# Patient Record
Sex: Female | Born: 1990
Health system: Southern US, Community
[De-identification: ages and names within clinical notes are randomized; demographics above are authoritative.]

## PROBLEM LIST (undated history)

## (undated) ENCOUNTER — Inpatient Hospital Stay (HOSPITAL_COMMUNITY): Payer: Self-pay

## (undated) DIAGNOSIS — F419 Anxiety disorder, unspecified: Secondary | ICD-10-CM

## (undated) DIAGNOSIS — R87629 Unspecified abnormal cytological findings in specimens from vagina: Secondary | ICD-10-CM

## (undated) DIAGNOSIS — R87619 Unspecified abnormal cytological findings in specimens from cervix uteri: Secondary | ICD-10-CM

## (undated) DIAGNOSIS — F32A Depression, unspecified: Secondary | ICD-10-CM

## (undated) DIAGNOSIS — F329 Major depressive disorder, single episode, unspecified: Secondary | ICD-10-CM

## (undated) DIAGNOSIS — IMO0002 Reserved for concepts with insufficient information to code with codable children: Secondary | ICD-10-CM

## (undated) DIAGNOSIS — Z8619 Personal history of other infectious and parasitic diseases: Secondary | ICD-10-CM

## (undated) DIAGNOSIS — J45909 Unspecified asthma, uncomplicated: Secondary | ICD-10-CM

## (undated) DIAGNOSIS — G971 Other reaction to spinal and lumbar puncture: Secondary | ICD-10-CM

## (undated) DIAGNOSIS — R51 Headache: Secondary | ICD-10-CM

## (undated) DIAGNOSIS — O24419 Gestational diabetes mellitus in pregnancy, unspecified control: Secondary | ICD-10-CM

## (undated) HISTORY — DX: Unspecified abnormal cytological findings in specimens from cervix uteri: R87.619

## (undated) HISTORY — PX: OTHER SURGICAL HISTORY: SHX169

## (undated) HISTORY — DX: Major depressive disorder, single episode, unspecified: F32.9

## (undated) HISTORY — DX: Anxiety disorder, unspecified: F41.9

## (undated) HISTORY — DX: Unspecified abnormal cytological findings in specimens from vagina: R87.629

## (undated) HISTORY — PX: COLPOSCOPY: SHX161

## (undated) HISTORY — DX: Personal history of other infectious and parasitic diseases: Z86.19

## (undated) HISTORY — DX: Reserved for concepts with insufficient information to code with codable children: IMO0002

## (undated) HISTORY — DX: Other reaction to spinal and lumbar puncture: G97.1

## (undated) HISTORY — DX: Gestational diabetes mellitus in pregnancy, unspecified control: O24.419

## (undated) HISTORY — PX: TYMPANOSTOMY TUBE PLACEMENT: SHX32

## (undated) HISTORY — DX: Headache: R51

## (undated) HISTORY — DX: Depression, unspecified: F32.A

---
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
## 2011-10-24 ENCOUNTER — Other Ambulatory Visit: Payer: Self-pay | Admitting: Gastroenterology

## 2011-10-24 DIAGNOSIS — R110 Nausea: Secondary | ICD-10-CM

## 2011-10-29 ENCOUNTER — Ambulatory Visit
Admission: RE | Admit: 2011-10-29 | Discharge: 2011-10-29 | Disposition: A | Discharge status: Home / Self Care | Payer: BC Managed Care – PPO | Source: Ambulatory Visit | Attending: Gastroenterology | Admitting: Gastroenterology

## 2011-10-29 DIAGNOSIS — R110 Nausea: Secondary | ICD-10-CM

## 2011-12-04 LAB — OB RESULTS CONSOLE GC/CHLAMYDIA
Chlamydia: NEGATIVE
Gonorrhea: NEGATIVE

## 2011-12-04 LAB — OB RESULTS CONSOLE RPR: RPR: NONREACTIVE

## 2011-12-04 LAB — OB RESULTS CONSOLE HEPATITIS B SURFACE ANTIGEN: Hepatitis B Surface Ag: NEGATIVE

## 2011-12-04 LAB — OB RESULTS CONSOLE RUBELLA ANTIBODY, IGM: Rubella: IMMUNE

## 2011-12-04 LAB — OB RESULTS CONSOLE ABO/RH: RH Type: POSITIVE

## 2011-12-04 LAB — OB RESULTS CONSOLE ANTIBODY SCREEN: Antibody Screen: NEGATIVE

## 2011-12-04 LAB — OB RESULTS CONSOLE HIV ANTIBODY (ROUTINE TESTING): HIV: NONREACTIVE

## 2012-07-07 ENCOUNTER — Inpatient Hospital Stay (HOSPITAL_COMMUNITY)
Admission: AD | Admit: 2012-07-07 | Discharge: 2012-07-07 | Disposition: A | Discharge status: Home / Self Care | Payer: BC Managed Care – PPO | Source: Ambulatory Visit | Attending: Obstetrics and Gynecology | Admitting: Obstetrics and Gynecology

## 2012-07-07 ENCOUNTER — Encounter (HOSPITAL_COMMUNITY): Payer: Self-pay

## 2012-07-07 DIAGNOSIS — O479 False labor, unspecified: Secondary | ICD-10-CM | POA: Insufficient documentation

## 2012-07-07 DIAGNOSIS — N949 Unspecified condition associated with female genital organs and menstrual cycle: Secondary | ICD-10-CM | POA: Insufficient documentation

## 2012-07-07 HISTORY — DX: Unspecified asthma, uncomplicated: J45.909

## 2012-07-07 NOTE — MAU Note (Signed)
Patient states she has some leaking of clear fluid at about 1530, not actively leaking at this time. Denies bleeding or contractions and reports good fetal movement.

## 2012-07-14 NOTE — L&D Delivery Note (Deleted)
Delivery Note At  a viable female was delivered via  NSVD (Presentation:oa ;  ).  APGAR:8/9 , ; weight . pending  Placenta status:intact , .3 vessel  Cord:  with the following complications: none.  Cord pH: na  Anesthesia: Epidural  Episiotomy: none Lacerations: second degree Suture Repair: 2.0 chromic Est. Blood Loss (mL): 250 cc  Mom to postpartum.  Baby to nursery-stable.  [name] S 07/26/2012, 12:09 PM

## 2012-07-16 ENCOUNTER — Encounter (HOSPITAL_COMMUNITY): Payer: Self-pay | Admitting: *Deleted

## 2012-07-16 ENCOUNTER — Telehealth (HOSPITAL_COMMUNITY): Payer: Self-pay | Admitting: *Deleted

## 2012-07-16 LAB — OB RESULTS CONSOLE GBS: GBS: NEGATIVE

## 2012-07-16 NOTE — Telephone Encounter (Signed)
Preadmission screen  

## 2012-07-20 ENCOUNTER — Inpatient Hospital Stay (HOSPITAL_COMMUNITY): Admission: RE | Admit: 2012-07-20 | Payer: BC Managed Care – PPO | Source: Ambulatory Visit

## 2012-07-25 ENCOUNTER — Inpatient Hospital Stay (HOSPITAL_COMMUNITY)
Admission: RE | Admit: 2012-07-25 | Discharge: 2012-07-29 | DRG: 371 | Disposition: A | Discharge status: Home / Self Care | Payer: BC Managed Care – PPO | Source: Ambulatory Visit | Attending: Obstetrics and Gynecology | Admitting: Obstetrics and Gynecology

## 2012-07-25 VITALS — BP 119/82 | HR 93 | Temp 97.8°F | Resp 18 | Ht 65.0 in | Wt 195.0 lb

## 2012-07-25 DIAGNOSIS — O480 Post-term pregnancy: Principal | ICD-10-CM | POA: Diagnosis present

## 2012-07-25 DIAGNOSIS — Z98891 History of uterine scar from previous surgery: Secondary | ICD-10-CM

## 2012-07-25 DIAGNOSIS — O324XX0 Maternal care for high head at term, not applicable or unspecified: Secondary | ICD-10-CM | POA: Diagnosis present

## 2012-07-25 LAB — CBC
HCT: 36 % (ref 36.0–46.0)
Hemoglobin: 12.4 g/dL (ref 12.0–15.0)
RBC: 4.08 MIL/uL (ref 3.87–5.11)
WBC: 11.6 10*3/uL — ABNORMAL HIGH (ref 4.0–10.5)

## 2012-07-25 MED ORDER — ONDANSETRON HCL 4 MG/2ML IJ SOLN: 4.0000 mg | Freq: Four times a day (QID) | INTRAMUSCULAR | Status: DC | PRN

## 2012-07-25 MED ORDER — OXYTOCIN 40 UNITS IN LACTATED RINGERS INFUSION - SIMPLE MED
62.5000 mL/h | INTRAVENOUS | Status: DC
  Filled 2012-07-25: qty 1000

## 2012-07-25 MED ORDER — FLEET ENEMA 7-19 GM/118ML RE ENEM: 1.0000 | ENEMA | RECTAL | Status: DC | PRN

## 2012-07-25 MED ORDER — ZOLPIDEM TARTRATE 5 MG PO TABS
5.0000 mg | ORAL_TABLET | Freq: Every evening | ORAL | Status: DC | PRN
  Administered 2012-07-25: 5 mg via ORAL
  Filled 2012-07-25: qty 1

## 2012-07-25 MED ORDER — OXYTOCIN BOLUS FROM INFUSION: 500.0000 mL | INTRAVENOUS | Status: DC

## 2012-07-25 MED ORDER — LACTATED RINGERS IV SOLN
INTRAVENOUS | Status: DC
  Administered 2012-07-25 – 2012-07-26 (×5): via INTRAVENOUS

## 2012-07-25 MED ORDER — CITRIC ACID-SODIUM CITRATE 334-500 MG/5ML PO SOLN
30.0000 mL | ORAL | Status: DC | PRN
  Administered 2012-07-26: 30 mL via ORAL

## 2012-07-25 MED ORDER — BUTORPHANOL TARTRATE 1 MG/ML IJ SOLN
1.0000 mg | INTRAMUSCULAR | Status: DC | PRN
  Administered 2012-07-26 (×2): 1 mg via INTRAVENOUS
  Filled 2012-07-25 (×2): qty 1

## 2012-07-25 MED ORDER — LIDOCAINE HCL (PF) 1 % IJ SOLN
30.0000 mL | INTRAMUSCULAR | Status: DC | PRN
  Filled 2012-07-25: qty 30

## 2012-07-25 MED ORDER — OXYTOCIN 40 UNITS IN LACTATED RINGERS INFUSION - SIMPLE MED: 1.0000 m[IU]/min | INTRAVENOUS | Status: DC

## 2012-07-25 MED ORDER — ACETAMINOPHEN 325 MG PO TABS: 650.0000 mg | ORAL_TABLET | ORAL | Status: DC | PRN

## 2012-07-25 MED ORDER — TERBUTALINE SULFATE 1 MG/ML IJ SOLN: 0.2500 mg | Freq: Once | INTRAMUSCULAR | Status: AC | PRN

## 2012-07-25 MED ORDER — OXYCODONE-ACETAMINOPHEN 5-325 MG PO TABS: 1.0000 | ORAL_TABLET | ORAL | Status: DC | PRN

## 2012-07-25 MED ORDER — LACTATED RINGERS IV SOLN: 500.0000 mL | INTRAVENOUS | Status: DC | PRN

## 2012-07-25 MED ORDER — IBUPROFEN 600 MG PO TABS: 600.0000 mg | ORAL_TABLET | Freq: Four times a day (QID) | ORAL | Status: DC | PRN

## 2012-07-26 ENCOUNTER — Encounter (HOSPITAL_COMMUNITY): Payer: Self-pay | Admitting: Anesthesiology

## 2012-07-26 ENCOUNTER — Encounter (HOSPITAL_COMMUNITY): Payer: Self-pay

## 2012-07-26 ENCOUNTER — Inpatient Hospital Stay (HOSPITAL_COMMUNITY): Payer: BC Managed Care – PPO | Admitting: Anesthesiology

## 2012-07-26 ENCOUNTER — Encounter (HOSPITAL_COMMUNITY)
Admission: RE | Disposition: A | Discharge status: Home / Self Care | Payer: Self-pay | Source: Ambulatory Visit | Attending: Obstetrics and Gynecology

## 2012-07-26 DIAGNOSIS — Z98891 History of uterine scar from previous surgery: Secondary | ICD-10-CM

## 2012-07-26 SURGERY — Surgical Case
Anesthesia: Epidural | Site: Abdomen | Wound class: Clean Contaminated

## 2012-07-26 MED ORDER — WITCH HAZEL-GLYCERIN EX PADS: 1.0000 "application " | MEDICATED_PAD | CUTANEOUS | Status: DC | PRN

## 2012-07-26 MED ORDER — DIBUCAINE 1 % RE OINT: 1.0000 "application " | TOPICAL_OINTMENT | RECTAL | Status: DC | PRN

## 2012-07-26 MED ORDER — ONDANSETRON HCL 4 MG PO TABS: 4.0000 mg | ORAL_TABLET | ORAL | Status: DC | PRN

## 2012-07-26 MED ORDER — OXYCODONE-ACETAMINOPHEN 5-325 MG PO TABS: 1.0000 | ORAL_TABLET | ORAL | Status: DC | PRN

## 2012-07-26 MED ORDER — SODIUM BICARBONATE 8.4 % IV SOLN
INTRAVENOUS | Status: AC
  Filled 2012-07-26: qty 50

## 2012-07-26 MED ORDER — LIDOCAINE-EPINEPHRINE (PF) 2 %-1:200000 IJ SOLN
INTRAMUSCULAR | Status: AC
  Filled 2012-07-26: qty 20

## 2012-07-26 MED ORDER — SIMETHICONE 80 MG PO CHEW
80.0000 mg | CHEWABLE_TABLET | Freq: Three times a day (TID) | ORAL | Status: DC
  Administered 2012-07-27 – 2012-07-29 (×6): 80 mg via ORAL

## 2012-07-26 MED ORDER — KETOROLAC TROMETHAMINE 30 MG/ML IJ SOLN
30.0000 mg | Freq: Four times a day (QID) | INTRAMUSCULAR | Status: AC | PRN
  Administered 2012-07-27: 30 mg via INTRAVENOUS
  Filled 2012-07-26: qty 1

## 2012-07-26 MED ORDER — ONDANSETRON HCL 4 MG/2ML IJ SOLN
INTRAMUSCULAR | Status: DC | PRN
  Administered 2012-07-26: 4 mg via INTRAVENOUS

## 2012-07-26 MED ORDER — ONDANSETRON HCL 4 MG/2ML IJ SOLN: 4.0000 mg | INTRAMUSCULAR | Status: DC | PRN

## 2012-07-26 MED ORDER — SENNOSIDES-DOCUSATE SODIUM 8.6-50 MG PO TABS
2.0000 | ORAL_TABLET | Freq: Every day | ORAL | Status: DC
  Administered 2012-07-27 – 2012-07-28 (×2): 2 via ORAL

## 2012-07-26 MED ORDER — SCOPOLAMINE 1 MG/3DAYS TD PT72
1.0000 | MEDICATED_PATCH | Freq: Once | TRANSDERMAL | Status: AC
  Administered 2012-07-26: 1.5 mg via TRANSDERMAL

## 2012-07-26 MED ORDER — OXYCODONE-ACETAMINOPHEN 5-325 MG PO TABS
1.0000 | ORAL_TABLET | ORAL | Status: DC | PRN
  Administered 2012-07-27 – 2012-07-29 (×2): 1 via ORAL
  Filled 2012-07-26 (×2): qty 1

## 2012-07-26 MED ORDER — TETANUS-DIPHTH-ACELL PERTUSSIS 5-2.5-18.5 LF-MCG/0.5 IM SUSP: 0.5000 mL | Freq: Once | INTRAMUSCULAR | Status: DC

## 2012-07-26 MED ORDER — SENNOSIDES-DOCUSATE SODIUM 8.6-50 MG PO TABS: 2.0000 | ORAL_TABLET | Freq: Every day | ORAL | Status: DC

## 2012-07-26 MED ORDER — BISACODYL 10 MG RE SUPP: 10.0000 mg | Freq: Every day | RECTAL | Status: DC | PRN

## 2012-07-26 MED ORDER — BUPIVACAINE HCL (PF) 0.25 % IJ SOLN
INTRAMUSCULAR | Status: DC | PRN
  Administered 2012-07-26: 10 mL via EPIDURAL

## 2012-07-26 MED ORDER — SODIUM CHLORIDE 0.9 % IJ SOLN: 3.0000 mL | Freq: Two times a day (BID) | INTRAMUSCULAR | Status: DC

## 2012-07-26 MED ORDER — SODIUM CHLORIDE 0.9 % IJ SOLN: 3.0000 mL | INTRAMUSCULAR | Status: DC | PRN

## 2012-07-26 MED ORDER — DIPHENHYDRAMINE HCL 25 MG PO CAPS: 25.0000 mg | ORAL_CAPSULE | Freq: Four times a day (QID) | ORAL | Status: DC | PRN

## 2012-07-26 MED ORDER — OXYTOCIN 40 UNITS IN LACTATED RINGERS INFUSION - SIMPLE MED: 62.5000 mL/h | INTRAVENOUS | Status: AC

## 2012-07-26 MED ORDER — LANOLIN HYDROUS EX OINT: TOPICAL_OINTMENT | CUTANEOUS | Status: DC | PRN

## 2012-07-26 MED ORDER — NALBUPHINE HCL 10 MG/ML IJ SOLN
5.0000 mg | INTRAMUSCULAR | Status: DC | PRN
Start: 2012-07-26 — End: 2012-07-29

## 2012-07-26 MED ORDER — MEPERIDINE HCL 25 MG/ML IJ SOLN
6.2500 mg | INTRAMUSCULAR | Status: AC | PRN
  Administered 2012-07-26 (×2): 6.25 mg via INTRAVENOUS

## 2012-07-26 MED ORDER — PHENYLEPHRINE 40 MCG/ML (10ML) SYRINGE FOR IV PUSH (FOR BLOOD PRESSURE SUPPORT)
80.0000 ug | PREFILLED_SYRINGE | INTRAVENOUS | Status: DC | PRN
  Filled 2012-07-26: qty 5

## 2012-07-26 MED ORDER — FENTANYL 2.5 MCG/ML BUPIVACAINE 1/10 % EPIDURAL INFUSION (WH - ANES)
14.0000 mL/h | INTRAMUSCULAR | Status: DC
  Administered 2012-07-26 (×2): 14 mL/h via EPIDURAL
  Filled 2012-07-26 (×2): qty 125

## 2012-07-26 MED ORDER — KETOROLAC TROMETHAMINE 60 MG/2ML IM SOLN
INTRAMUSCULAR | Status: AC
  Administered 2012-07-26: 60 mg via INTRAMUSCULAR
  Filled 2012-07-26: qty 2

## 2012-07-26 MED ORDER — MORPHINE SULFATE (PF) 0.5 MG/ML IJ SOLN
INTRAMUSCULAR | Status: DC | PRN
  Administered 2012-07-26: 1 mg via INTRAVENOUS
  Administered 2012-07-26: 4 mg via EPIDURAL

## 2012-07-26 MED ORDER — KETOROLAC TROMETHAMINE 30 MG/ML IJ SOLN: 30.0000 mg | Freq: Four times a day (QID) | INTRAMUSCULAR | Status: AC | PRN

## 2012-07-26 MED ORDER — MEPERIDINE HCL 25 MG/ML IJ SOLN
INTRAMUSCULAR | Status: DC | PRN
  Administered 2012-07-26: 25 mg via INTRAVENOUS

## 2012-07-26 MED ORDER — LACTATED RINGERS IV SOLN
INTRAVENOUS | Status: DC
  Administered 2012-07-27 (×2): via INTRAVENOUS

## 2012-07-26 MED ORDER — LACTATED RINGERS IV SOLN: 500.0000 mL | Freq: Once | INTRAVENOUS | Status: DC

## 2012-07-26 MED ORDER — DIPHENHYDRAMINE HCL 50 MG/ML IJ SOLN: 25.0000 mg | INTRAMUSCULAR | Status: DC | PRN

## 2012-07-26 MED ORDER — ONDANSETRON HCL 4 MG/2ML IJ SOLN
INTRAMUSCULAR | Status: AC
  Filled 2012-07-26: qty 2

## 2012-07-26 MED ORDER — MEPERIDINE HCL 25 MG/ML IJ SOLN
INTRAMUSCULAR | Status: AC
  Filled 2012-07-26: qty 1

## 2012-07-26 MED ORDER — LIDOCAINE HCL (PF) 1 % IJ SOLN
INTRAMUSCULAR | Status: DC | PRN
  Administered 2012-07-26 (×4): 4 mL

## 2012-07-26 MED ORDER — NALOXONE HCL 0.4 MG/ML IJ SOLN: 0.4000 mg | INTRAMUSCULAR | Status: DC | PRN

## 2012-07-26 MED ORDER — NALOXONE HCL 1 MG/ML IJ SOLN: 1.0000 ug/kg/h | INTRAVENOUS | Status: DC | PRN

## 2012-07-26 MED ORDER — IBUPROFEN 600 MG PO TABS: 600.0000 mg | ORAL_TABLET | Freq: Four times a day (QID) | ORAL | Status: DC

## 2012-07-26 MED ORDER — FENTANYL CITRATE 0.05 MG/ML IJ SOLN: 25.0000 ug | INTRAMUSCULAR | Status: DC | PRN

## 2012-07-26 MED ORDER — OXYTOCIN 10 UNIT/ML IJ SOLN
INTRAMUSCULAR | Status: AC
  Filled 2012-07-26: qty 1

## 2012-07-26 MED ORDER — LACTATED RINGERS IV SOLN
INTRAVENOUS | Status: DC | PRN
  Administered 2012-07-26: 19:00:00 via INTRAVENOUS

## 2012-07-26 MED ORDER — SCOPOLAMINE 1 MG/3DAYS TD PT72
MEDICATED_PATCH | TRANSDERMAL | Status: AC
  Filled 2012-07-26: qty 1

## 2012-07-26 MED ORDER — PRENATAL MULTIVITAMIN CH
1.0000 | ORAL_TABLET | Freq: Every day | ORAL | Status: DC
  Administered 2012-07-27 – 2012-07-29 (×3): 1 via ORAL
  Filled 2012-07-26 (×3): qty 1

## 2012-07-26 MED ORDER — PRENATAL MULTIVITAMIN CH: 1.0000 | ORAL_TABLET | Freq: Every day | ORAL | Status: DC

## 2012-07-26 MED ORDER — MEPERIDINE HCL 25 MG/ML IJ SOLN
INTRAMUSCULAR | Status: AC
  Administered 2012-07-26: 6.25 mg via INTRAVENOUS
  Filled 2012-07-26: qty 1

## 2012-07-26 MED ORDER — EPHEDRINE 5 MG/ML INJ
10.0000 mg | INTRAVENOUS | Status: DC | PRN
  Filled 2012-07-26: qty 4

## 2012-07-26 MED ORDER — SODIUM CHLORIDE 0.9 % IV SOLN: 250.0000 mL | INTRAVENOUS | Status: DC

## 2012-07-26 MED ORDER — LANOLIN HYDROUS EX OINT: 1.0000 "application " | TOPICAL_OINTMENT | CUTANEOUS | Status: DC | PRN

## 2012-07-26 MED ORDER — MORPHINE SULFATE 0.5 MG/ML IJ SOLN
INTRAMUSCULAR | Status: AC
  Filled 2012-07-26: qty 10

## 2012-07-26 MED ORDER — DIPHENHYDRAMINE HCL 25 MG PO CAPS
25.0000 mg | ORAL_CAPSULE | ORAL | Status: DC | PRN
  Filled 2012-07-26: qty 1

## 2012-07-26 MED ORDER — BENZOCAINE-MENTHOL 20-0.5 % EX AERO: 1.0000 "application " | INHALATION_SPRAY | CUTANEOUS | Status: DC | PRN

## 2012-07-26 MED ORDER — EPHEDRINE 5 MG/ML INJ: 10.0000 mg | INTRAVENOUS | Status: DC | PRN

## 2012-07-26 MED ORDER — BUPIVACAINE HCL (PF) 0.25 % IJ SOLN
INTRAMUSCULAR | Status: AC
  Filled 2012-07-26: qty 30

## 2012-07-26 MED ORDER — SIMETHICONE 80 MG PO CHEW: 80.0000 mg | CHEWABLE_TABLET | ORAL | Status: DC | PRN

## 2012-07-26 MED ORDER — PHENYLEPHRINE 40 MCG/ML (10ML) SYRINGE FOR IV PUSH (FOR BLOOD PRESSURE SUPPORT): 80.0000 ug | PREFILLED_SYRINGE | INTRAVENOUS | Status: DC | PRN

## 2012-07-26 MED ORDER — KETOROLAC TROMETHAMINE 60 MG/2ML IM SOLN
60.0000 mg | Freq: Once | INTRAMUSCULAR | Status: AC | PRN
  Administered 2012-07-26: 60 mg via INTRAMUSCULAR

## 2012-07-26 MED ORDER — DIPHENHYDRAMINE HCL 50 MG/ML IJ SOLN: 12.5000 mg | INTRAMUSCULAR | Status: DC | PRN

## 2012-07-26 MED ORDER — SODIUM BICARBONATE 8.4 % IV SOLN
INTRAVENOUS | Status: DC | PRN
  Administered 2012-07-26: 5 mL via EPIDURAL

## 2012-07-26 MED ORDER — MENTHOL 3 MG MT LOZG: 1.0000 | LOZENGE | OROMUCOSAL | Status: DC | PRN

## 2012-07-26 MED ORDER — ZOLPIDEM TARTRATE 5 MG PO TABS: 5.0000 mg | ORAL_TABLET | Freq: Every evening | ORAL | Status: DC | PRN

## 2012-07-26 MED ORDER — FLEET ENEMA 7-19 GM/118ML RE ENEM: 1.0000 | ENEMA | Freq: Every day | RECTAL | Status: DC | PRN

## 2012-07-26 MED ORDER — ONDANSETRON HCL 4 MG/2ML IJ SOLN: 4.0000 mg | Freq: Three times a day (TID) | INTRAMUSCULAR | Status: DC | PRN

## 2012-07-26 MED ORDER — CEFAZOLIN SODIUM-DEXTROSE 2-3 GM-% IV SOLR
INTRAVENOUS | Status: DC | PRN
  Administered 2012-07-26: 2 g via INTRAVENOUS

## 2012-07-26 MED ORDER — OXYTOCIN 10 UNIT/ML IJ SOLN
40.0000 [IU] | INTRAVENOUS | Status: DC | PRN
  Administered 2012-07-26: 40 [IU] via INTRAVENOUS

## 2012-07-26 MED ORDER — IBUPROFEN 600 MG PO TABS: 600.0000 mg | ORAL_TABLET | Freq: Four times a day (QID) | ORAL | Status: DC | PRN

## 2012-07-26 MED ORDER — METOCLOPRAMIDE HCL 5 MG/ML IJ SOLN: 10.0000 mg | Freq: Three times a day (TID) | INTRAMUSCULAR | Status: DC | PRN

## 2012-07-26 MED ORDER — IBUPROFEN 600 MG PO TABS
600.0000 mg | ORAL_TABLET | Freq: Four times a day (QID) | ORAL | Status: DC
  Administered 2012-07-27 – 2012-07-29 (×8): 600 mg via ORAL
  Filled 2012-07-26 (×8): qty 1

## 2012-07-26 SURGICAL SUPPLY — 36 items
ADH SKN CLS APL DERMABOND .7 (GAUZE/BANDAGES/DRESSINGS) ×1
CLOTH BEACON ORANGE TIMEOUT ST (SAFETY) ×2 IMPLANT
DERMABOND ADVANCED (GAUZE/BANDAGES/DRESSINGS) ×1
DERMABOND ADVANCED .7 DNX12 (GAUZE/BANDAGES/DRESSINGS) IMPLANT
DRAPE LG THREE QUARTER DISP (DRAPES) ×2 IMPLANT
DRESSING TELFA 8X3 (GAUZE/BANDAGES/DRESSINGS) IMPLANT
DRSG OPSITE POSTOP 4X10 (GAUZE/BANDAGES/DRESSINGS) ×2 IMPLANT
DURAPREP 26ML APPLICATOR (WOUND CARE) ×2 IMPLANT
ELECT REM PT RETURN 9FT ADLT (ELECTROSURGICAL) ×2
ELECTRODE REM PT RTRN 9FT ADLT (ELECTROSURGICAL) ×1 IMPLANT
EXTRACTOR VACUUM M CUP 4 TUBE (SUCTIONS) IMPLANT
GAUZE SPONGE 4X4 12PLY STRL LF (GAUZE/BANDAGES/DRESSINGS) ×2 IMPLANT
GLOVE BIO SURGEON STRL SZ7 (GLOVE) ×4 IMPLANT
GOWN STRL REIN XL XLG (GOWN DISPOSABLE) ×4 IMPLANT
KIT ABG SYR 3ML LUER SLIP (SYRINGE) ×2 IMPLANT
NDL HYPO 25X5/8 SAFETYGLIDE (NEEDLE) ×1 IMPLANT
NEEDLE HYPO 25X5/8 SAFETYGLIDE (NEEDLE) ×2 IMPLANT
NS IRRIG 1000ML POUR BTL (IV SOLUTION) ×2 IMPLANT
PACK C SECTION WH (CUSTOM PROCEDURE TRAY) ×2 IMPLANT
PAD ABD 7.5X8 STRL (GAUZE/BANDAGES/DRESSINGS) IMPLANT
PAD OB MATERNITY 4.3X12.25 (PERSONAL CARE ITEMS) ×2 IMPLANT
SLEEVE SCD COMPRESS KNEE MED (MISCELLANEOUS) IMPLANT
STAPLER VISISTAT 35W (STAPLE) IMPLANT
STRIP CLOSURE SKIN 1/4X4 (GAUZE/BANDAGES/DRESSINGS) ×2 IMPLANT
SUT CHROMIC 0 CT 1 (SUTURE) IMPLANT
SUT CHROMIC 0 CTX 36 (SUTURE) ×4 IMPLANT
SUT CHROMIC 2 0 SH (SUTURE) IMPLANT
SUT MON AB 4-0 PS1 27 (SUTURE) ×1 IMPLANT
SUT PDS AB 0 CT 36 (SUTURE) ×2 IMPLANT
SUT PLAIN 0 NONE (SUTURE) IMPLANT
SUT PLAIN 2 0 XLH (SUTURE) IMPLANT
SUT VIC AB 3-0 CT1 27 (SUTURE) ×2
SUT VIC AB 3-0 CT1 TAPERPNT 27 (SUTURE) ×1 IMPLANT
TOWEL OR 17X24 6PK STRL BLUE (TOWEL DISPOSABLE) ×6 IMPLANT
TRAY FOLEY CATH 14FR (SET/KITS/TRAYS/PACK) ×2 IMPLANT
WATER STERILE IRR 1000ML POUR (IV SOLUTION) ×1 IMPLANT

## 2012-07-26 NOTE — Brief Op Note (Signed)
07/25/2012 - 07/26/2012  7:49 PM  PATIENT:  [name]  22 y.o. female  PRE-OPERATIVE DIAGNOSIS:  Arrest of Descent  POST-OPERATIVE DIAGNOSIS:  Arrest of Descent  PROCEDURE:  Procedure(s) (LRB) with comments: CESAREAN SECTION (N/A)  SURGEON:  Surgeon(s) and Role:    * Juluis Mire, MD - Primary  PHYSICIAN ASSISTANT:   ASSISTANTS: none   ANESTHESIA:   epidural  EBL:  Total I/O In: -  Out: 700 [Urine:200; Blood:500]  BLOOD ADMINISTERED:none  DRAINS: Urinary Catheter (Foley)   LOCAL MEDICATIONS USED:  NONE  SPECIMEN:  No Specimen  DISPOSITION OF SPECIMEN:  N/A  COUNTS:  YES  TOURNIQUET:  * No tourniquets in log *  DICTATION: .Other Dictation: Dictation Number [id number]  PLAN OF CARE: Admit to inpatient   PATIENT DISPOSITION:  PACU - hemodynamically stable.   Delay start of Pharmacological VTE agent (>24hrs) due to surgical blood loss or risk of bleeding: no

## 2012-07-26 NOTE — H&P (Signed)
[name] is a 22 y.o. female presenting for induction for postdates. Is 23.1. Uncomplicated PNC> Maternal Medical History:  Fetal activity: Perceived fetal activity is normal.    Prenatal complications: no prenatal complications Prenatal Complications - Diabetes: none.    OB History    Grav Para Term Preterm Abortions TAB SAB Ect Mult Living   1              Past Medical History  Diagnosis Date  . H/O varicella   . Abnormal Pap smear     had colpo with abnormal paps afterward  . Asthma     childhood  . Headache     miagraine  . Anxiety   . Depression    Past Surgical History  Procedure Date  . Tympanostomy tube placement   . Wisdom teeth   . Colposcopy    Family History: family history includes Alcohol abuse in her maternal grandfather and paternal grandfather; Arthritis in her mother; Asthma in her mother; Cancer in her maternal aunt, maternal grandfather, and paternal uncle; Diabetes in her paternal aunt and paternal grandmother; Hyperlipidemia in her mother; Hypertension in her mother and paternal grandmother; Kidney Stones in her father; Stroke in her father; and Thyroid disease in her maternal grandmother. Social History:  reports that she has never smoked. She has never used smokeless tobacco. She reports that she does not drink alcohol or use illicit drugs.   Prenatal Transfer Tool  Maternal Diabetes: No Genetic Screening: Normal Maternal Ultrasounds/Referrals: Normal Fetal Ultrasounds or other Referrals:  None Maternal Substance Abuse:  No Significant Maternal Medications:  None Significant Maternal Lab Results:  None Other Comments:  None  ROS  Dilation: 2 Effacement (%): 90 Station: -2 Exam by:: Dr.Camillo Quadros Blood pressure 106/50, pulse 78, temperature 97.5 F (36.4 C), temperature source Oral, resp. rate 20, height 5\' 5"  (1.651 m), weight 88.451 kg (195 lb). Maternal Exam:  Uterine Assessment: Contraction strength is mild.  Contraction frequency  is regular.   Abdomen: Patient reports no abdominal tenderness. Fundal height is 39 cm.   Estimated fetal weight is 7.   Fetal presentation: vertex  Introitus: Amniotic fluid character: clear.  Pelvis: adequate for delivery.   Cervix: Cervix evaluated by digital exam.   Cervix 2+ and 90% vtx at -1  Physical Exam  Prenatal labs: ABO, Rh: O/Positive/-- (05/23 0000) Antibody: Negative (05/23 0000) Rubella: Immune (05/23 0000) RPR: NON REACTIVE (01/12 2015)  HBsAg: Negative (05/23 0000)  HIV: Non-reactive (05/23 0000)  GBS: Negative (01/03 0000)   Assessment/Plan: intrauterine pregnancy at 41.1 for induction Risk of pitocin discussed   [name] S 07/26/2012, 7:51 AM

## 2012-07-26 NOTE — Progress Notes (Signed)
Dr. Arelia Sneddon on phone notified of pt not feeling pressure in perinium or rectal area, pt feeling UC, SVE 10/100%/0,+1, pt to stop pushing and labor down.

## 2012-07-26 NOTE — Anesthesia Preprocedure Evaluation (Addendum)
Anesthesia Evaluation  Patient identified by MRN, date of birth, ID band Patient awake    Reviewed: Allergy & Precautions, H&P , NPO status , Patient's Chart, lab work & pertinent test results, reviewed documented beta blocker date and time   History of Anesthesia Complications Negative for: history of anesthetic complications  Airway Mallampati: I TM Distance: >3 FB Neck ROM: full    Dental  (+) Teeth Intact   Pulmonary  breath sounds clear to auscultation        Cardiovascular negative cardio ROS  Rhythm:regular Rate:Normal     Neuro/Psych  Headaches (migraines - last several months ago), PSYCHIATRIC DISORDERS (anxiety/depression - no meds)    GI/Hepatic negative GI ROS, Neg liver ROS,   Endo/Other  negative endocrine ROS  Renal/GU negative Renal ROS  negative genitourinary   Musculoskeletal   Abdominal   Peds  Hematology negative hematology ROS (+)   Anesthesia Other Findings   Reproductive/Obstetrics (+) Pregnancy (failure to descend)                          Anesthesia Physical Anesthesia Plan  ASA: II and emergent  Anesthesia Plan: Epidural   Post-op Pain Management:    Induction:   Airway Management Planned:   Additional Equipment:   Intra-op Plan:   Post-operative Plan:   Informed Consent: I have reviewed the patients History and Physical, chart, labs and discussed the procedure including the risks, benefits and alternatives for the proposed anesthesia with the patient or authorized representative who has indicated his/her understanding and acceptance.     Plan Discussed with: Surgeon and CRNA  Anesthesia Plan Comments:        Anesthesia Quick Evaluation

## 2012-07-26 NOTE — Anesthesia Procedure Notes (Signed)
Epidural Patient location during procedure: OB Start time: 07/26/2012 9:03 AM  Staffing Performed by: anesthesiologist   Preanesthetic Checklist Completed: patient identified, site marked, surgical consent, pre-op evaluation, timeout performed, IV checked, risks and benefits discussed and monitors and equipment checked  Epidural Patient position: sitting Prep: site prepped and draped and DuraPrep Patient monitoring: continuous pulse ox and blood pressure Approach: midline Injection technique: LOR air  Needle:  Needle type: Tuohy  Needle gauge: 17 G Needle length: 9 cm and 9 Needle insertion depth: 6 cm Catheter type: closed end flexible Catheter size: 19 Gauge Catheter at skin depth: 11 cm Test dose: negative  Assessment Events: blood not aspirated, injection not painful, no injection resistance, negative IV test and no paresthesia  Additional Notes Discussed risk of headache, infection, bleeding, nerve injury and failed or incomplete block.  Patient voices understanding and wishes to proceed.   Epidural placed easily on first attempt.  Patient tolerated procedure well with no apparent complications.  Jasmine December, MDReason for block:procedure for pain

## 2012-07-26 NOTE — Transfer of Care (Signed)
Immediate Anesthesia Transfer of Care Note  Patient: [name]  Procedure(s) Performed: Procedure(s) (LRB) with comments: CESAREAN SECTION (N/A)  Patient Location: PACU  Anesthesia Type:Epidural  Level of Consciousness: awake, alert , oriented and patient cooperative  Airway & Oxygen Therapy: Patient Spontanous Breathing  Post-op Assessment: Report given to PACU RN and Post -op Vital signs reviewed and stable  Post vital signs: Reviewed and stable  Complications: No apparent anesthesia complications

## 2012-07-26 NOTE — Anesthesia Postprocedure Evaluation (Signed)
  Anesthesia Post-op Note  Patient: [name]  Procedure(s) Performed: Procedure(s) (LRB) with comments: CESAREAN SECTION (N/A)  Patient Location: PACU  Anesthesia Type:Epidural  Level of Consciousness: awake, alert  and oriented  Airway and Oxygen Therapy: Patient Spontanous Breathing  Post-op Pain: none  Post-op Assessment: Post-op Vital signs reviewed, Patient's Cardiovascular Status Stable, Respiratory Function Stable, Patent Airway, No signs of Nausea or vomiting, Pain level controlled, No headache and No backache  Post-op Vital Signs: Reviewed and stable  Complications: No apparent anesthesia complications

## 2012-07-26 NOTE — Progress Notes (Signed)
Patient ID: [name], female   DOB: 25-Jan-1991, 22 y.o.   MRN: [id number] Pushing for > 2 hour no descent Decline further attempt at pushing Possible large efw  vtx with increased caput Precede with ceasarean section Risk of cesarean section discussed.  These include:  Risk of infection;  Risk of hemorrhage that could require transfusions with the associated risk of aids or hepatitis;  Excessive bleeding could require hysterectomy;  Risk of injury to adjacent organs including bladder, bowel or ureters;  Risk of DVT's and possible pulmonary embolus.  Patient expresses a understanding of indications and risks.;

## 2012-07-26 NOTE — Op Note (Signed)
Patient name  [name], [name] DICTATION#  [id number] CSN# [id number]  Juluis Mire, MD 07/26/2012 7:50 PM

## 2012-07-27 ENCOUNTER — Encounter (HOSPITAL_COMMUNITY): Payer: Self-pay

## 2012-07-27 LAB — CBC
MCHC: 33.9 g/dL (ref 30.0–36.0)
MCV: 89.1 fL (ref 78.0–100.0)
Platelets: 120 10*3/uL — ABNORMAL LOW (ref 150–400)
RDW: 13.7 % (ref 11.5–15.5)
WBC: 18.5 10*3/uL — ABNORMAL HIGH (ref 4.0–10.5)

## 2012-07-27 NOTE — Progress Notes (Signed)
Subjective: Postpartum Day 1: Cesarean Delivery Patient reports tolerating PO.    Objective: Vital signs in last 24 hours: Temp:  [97.6 F (36.4 C)-99.8 F (37.7 C)] 98.8 F (37.1 C) (01/14 0630) Pulse Rate:  [69-134] 108  (01/14 0630) Resp:  [16-24] 16  (01/14 0630) BP: (92-136)/(50-97) 92/52 mmHg (01/14 0630) SpO2:  [94 %-100 %] 96 % (01/14 0630)  Physical Exam:  General: alert and cooperative Lochia: appropriate Uterine Fundus: firm Incision: healing well DVT Evaluation: No evidence of DVT seen on physical exam. No significant calf/ankle edema. Foley continues, urine concentrated   [facility] 07/27/12 0540 07/25/12 2015  HGB 10.4* 12.4  HCT 30.1* 36.0    Assessment/Plan: Status post Cesarean section. Doing well postoperatively.  Encouraged oral fluids Continue current care.  CURTIS,CAROL G 07/27/2012, 7:44 AM

## 2012-07-27 NOTE — Anesthesia Postprocedure Evaluation (Signed)
  Anesthesia Post-op Note  Patient: [name]  Procedure(s) Performed: Procedure(s) (LRB) with comments: CESAREAN SECTION (N/A)  Patient Location: Mother/Baby  Anesthesia Type:Epidural  Level of Consciousness: awake, alert  and oriented  Airway and Oxygen Therapy: Patient Spontanous Breathing  Post-op Pain: mild  Post-op Assessment: Patient's Cardiovascular Status Stable, Respiratory Function Stable, No headache, No backache, No residual numbness and No residual motor weakness  Post-op Vital Signs: stable  Complications: No apparent anesthesia complications

## 2012-07-27 NOTE — Addendum Note (Signed)
Addendum  created 07/27/12 0839 by Earmon Phoenix, CRNA   Modules edited:Notes Section

## 2012-07-27 NOTE — Progress Notes (Signed)
Patient was referred for history of depression/anxiety.  * Referral screened out by Clinical Social Worker because none of the following criteria appear to apply:  ~ History of anxiety/depression during this pregnancy, or of post-partum depression.  ~ Diagnosis of anxiety and/or depression within last 5 years, as per pt.  ~ History of depression due to pregnancy loss/loss of child  OR  * Patient's symptoms currently being treated with medication and/or therapy.  Please contact the Clinical Social Worker if needs arise, or by the patient's request.   

## 2012-07-27 NOTE — Addendum Note (Signed)
Addendum  created 07/27/12 4782 by Earmon Phoenix, CRNA   Modules edited:Charges VN, Notes Section

## 2012-07-27 NOTE — Anesthesia Postprocedure Evaluation (Signed)
  Anesthesia Post-op Note  Patient: [name]  Procedure(s) Performed: Procedure(s) (LRB) with comments: CESAREAN SECTION (N/A)  Patient Location: Mother/Baby  Anesthesia Type:Epidural  Level of Consciousness: awake, alert  and oriented  Airway and Oxygen Therapy: Patient Spontanous Breathing  Post-op Pain: mild  Post-op Assessment: Patient's Cardiovascular Status Stable, Respiratory Function Stable, No headache, No backache, No residual numbness and No residual motor weakness  Post-op Vital Signs: stable  Complications: No apparent anesthesia complications 

## 2012-07-27 NOTE — Op Note (Signed)
NAMESURIE, [name]              ACCOUNT NO.:  [id number]  MEDICAL RECORD NO.:  [id number]  LOCATION:  9105                          FACILITY:  [facility]  PHYSICIAN:  Juluis Mire, M.D.   DATE OF BIRTH:  06/01/1991  DATE OF PROCEDURE:  07/26/2012 DATE OF DISCHARGE:                              OPERATIVE REPORT   PREOPERATIVE DIAGNOSIS:  Intrauterine pregnancy at 41 plus weeks with failure to descend.  POSTOPERATIVE DIAGNOSIS:  Intrauterine pregnancy at 41 plus weeks with failure to descend.  OPERATIVE PROCEDURE:  Low transverse cesarean section.  SURGEON:  Juluis Mire, MD  ANESTHESIA:  Epidural.  BLOOD LOSS:  4-500 mL.  PACKS:  None.  DRAINS:  Included urethral Foley.  INTRAOPERATIVE BLOOD PLACED:  None.  COMPLICATIONS:  None.  INDICATIONS:  A 22 year old female, presented at 41+ weeks for induction of labor.  She progressed to complete dilatation after 2 hours of pushing the vertex, remained arrested at approximately a +1 station. She had increasing caput.  We offered her to continue pushing.  She declined at the present time.  She states she is ready to proceed with cesarean section.  I think the head was too high for an assisted vaginal delivery.  Also, there was evidently a large estimated fetal weight. The risk of cesarean section were explained including the risk of infection.  Risk of hemorrhage that could require transfusion with the risk of AIDS or hepatitis.  Risk of injury to adjacent organs including bladder, bowel or ureters that could require a further exploratory surgery.  Risk of deep venous thrombosis and pulmonary embolus.  One other note, excessive bleeding could require a hysterectomy.  The patient expressed understanding of the indications and risks.  PROCEDURE:  The patient was taken to the OR and placed in supine position with left lateral tilt.  After satisfactory level of epidural anesthesia was obtained, the abdomen was prepped out with  Betadine and draped with sterile field.  A low transverse skin incision was made with a knife and carried through subcutaneous tissue.  Fascia was entered sharply.  Incision of the fascia was extended laterally.  Fascia was taken off the muscle superiorly inferiorly.  Rectus muscles were separated in the midline.  Peritoneum was entered sharply.  Incision of peritoneum was extended both superiorly and inferiorly.  A low- transverse bladder flap was developed.  A low transverse uterine incision begun with knife and extended laterally using manual traction. The infant presented in the vertex presentation and was delivered with elevation of head and fundal pressure.  The infant was a viable female, Apgars were 8 and 9.  Umbilical artery pH was 7.15.  Weight was pending. Placenta was delivered manually.  Uterus was exteriorized for closure. Uterus was closed with a running locking suture of 0 chromic using a two- layer closure technique.  We had good hemostasis.  Urine output was initially blood-tinged on arrival to the OR.  It was clearing at this point in time.  Tubes and ovaries were unremarkable.  Uterus was returned to the abdominal cavity.  We irrigated the pelvis and had no active bleeding.  Muscles and peritoneum were closed with running suture of 3-0 Vicryl.  Fascia was closed with running suture of 0 PDS.  Skin was closed with running subcuticular of 3-0 Monocryl and Dermabond. Sponge, instrument, and needle count was correct by circulating nurse x2.  Foley catheter was clear at the time of closure.  The patient tolerated the procedure well and was returned to recovery in good condition dictation.     Juluis Mire, M.D.     JSM/MEDQ  D:  07/26/2012  T:  07/27/2012  Job:  [id number]

## 2012-07-28 NOTE — Progress Notes (Signed)
Subjective: Postpartum Day 2: Cesarean Delivery Patient reports tolerating PO, + flatus and no problems voiding.    Objective: Vital signs in last 24 hours: Temp:  [97.4 F (36.3 C)-99.8 F (37.7 C)] 97.4 F (36.3 C) (01/15 0510) Pulse Rate:  [76-105] 76  (01/15 0510) Resp:  [18] 18  (01/15 0510) BP: (89-107)/(47-66) 105/66 mmHg (01/15 0510) SpO2:  [94 %-95 %] 95 % (01/14 1815)  Physical Exam:  General: alert and cooperative Lochia: appropriate Uterine Fundus: firm Incision: healing well, no significant drainage DVT Evaluation: No evidence of DVT seen on physical exam. No cords or calf tenderness. No significant calf/ankle edema.   Basename 07/27/12 0540 07/25/12 2015  HGB 10.4* 12.4  HCT 30.1* 36.0    Assessment/Plan: Status post Cesarean section. Doing well postoperatively.  Continue current care.  Zuri Bradway G 07/28/2012, 7:50 AM

## 2012-07-29 MED ORDER — OXYCODONE-ACETAMINOPHEN 5-325 MG PO TABS: 1.0000 | ORAL_TABLET | ORAL | Status: DC | PRN

## 2012-07-29 MED ORDER — IBUPROFEN 600 MG PO TABS: 600.0000 mg | ORAL_TABLET | Freq: Four times a day (QID) | ORAL | Status: DC

## 2012-07-29 NOTE — Discharge Summary (Signed)
Obstetric Discharge Summary Reason for Admission: induction of labor Prenatal Procedures: ultrasound Intrapartum Procedures: cesarean: low cervical, transverse Postpartum Procedures: none Complications-Operative and Postpartum: none Hemoglobin  Date Value Range Status  07/27/2012 10.4* 12.0 - 15.0 g/dL Final     HCT  Date Value Range Status  07/27/2012 30.1* 36.0 - 46.0 % Final    Physical Exam:  General: alert and cooperative Lochia: appropriate Uterine Fundus: firm Incision: healing well, no significant drainage DVT Evaluation: No evidence of DVT seen on physical exam. No significant calf/ankle edema.  Discharge Diagnoses: Term Pregnancy-delivered  Discharge Information: Date: 07/29/2012 Activity: pelvic rest Diet: routine Medications: PNV, Ibuprofen and Percocet Condition: stable Instructions: refer to practice specific booklet Discharge to: home   Newborn Data: Live born female  Birth Weight: 8 lb 3.9 oz (3740 g) APGAR: 8, 9  Home with mother.  CURTIS,CAROL G 07/29/2012, 7:51 AM

## 2012-08-04 ENCOUNTER — Ambulatory Visit (HOSPITAL_COMMUNITY)
Admission: RE | Admit: 2012-08-04 | Discharge: 2012-08-04 | Disposition: A | Discharge status: Home / Self Care | Payer: BC Managed Care – PPO | Source: Ambulatory Visit | Attending: Obstetrics and Gynecology | Admitting: Obstetrics and Gynecology

## 2012-08-04 NOTE — Progress Notes (Addendum)
Infant Lactation Consultation Outpatient Visit Note  Patient Name: [name]  Baby's name [name] Date of Birth: Oct 02, 1990                Baby's DOB 07/[birth year] Birth Weight:                                 Birth weight: 8 lbs 3..9 oz Gestational Age at Delivery: 41 weeks Type of Delivery: Cesarean Section  Breastfeeding History Frequency of Breastfeeding: 1-2 times a day Length of Feeding: 20-39 mins Voids: 6/ 24 hrs Stools: 8/24 hrs   Supplementing / Method: Pumping:  Type of Pump: Medela Pump in Style double electric breast pump   Frequency:  Every 4 hrs  Volume:  60 ml  Comments: Mom decided she would rather pump and bottle feed, as she was unsure how much baby was getting when she breast fed, plus it was uncomfortable.  Talked to her about what she would like to accomplish today, and she said she would like to see how much the baby can get from the breast.  She is returning to work in 8 weeks, and she doesn't know whether she will be able to pump at work.  She is also very tired, and wants to get some rest.  Consultation Evaluation:  Initial Feeding Assessment: Pre-feed Weight:  3476 gm Post-feed Weight:  3518 gm Amount Transferred: 42 ml Comments: left breast using cross cradle hold, and breast compression, baby skin to skin.  Assisted Mom in latching baby deeply, without pain, and Mom stated it felt a lot better.  Adjusted hand placement on the breast, and how quickly she should latch baby when she opens her mouth.    Additional Feeding Assessment: Pre-feed Weight: 3518 gm Post-feed Weight:  3526 gm Amount Transferred: 6 ml Comments:  Right breast using cross cradle hold, and breast compression, baby skin to skin   Total Breast milk Transferred this Visit: 50 ml Total Supplement Given: none  Mom given instructions on how to carefully position and latch baby for optimum milk transfer.  Recommended she feed baby every 2-3 hrs, waking her as needed until she is  weighing more than her birth weight.  Encouraged her to massage, and milk her breasts during feedings to increase milk transfer.  If pumping only is chosen, Mom to pump for shorter durations (20 min rather than 30 min) and more frequently, every 2-3 hrs, rather than every 4 hrs.  Amount baby needs by bottle is 60-75 ml of expressed breast milk.  Additional Interventions: Reminded Mom of Breast Feeding Support Group on Tuesdays at 11 am. Handout on Fenugreek and Moringa supplements given to Mom Comfort Gels given for soreness  Follow-Up  Pediatrician 08/11/12 To call us prn   Judee Clara 08/04/2012, 1:26 PM

## 2012-09-16 ENCOUNTER — Ambulatory Visit (INDEPENDENT_AMBULATORY_CARE_PROVIDER_SITE_OTHER): Payer: BC Managed Care – PPO | Admitting: Family Medicine

## 2012-09-16 VITALS — BP 122/72 | HR 87 | Temp 98.1°F | Resp 16 | Ht 65.0 in | Wt 171.0 lb

## 2012-09-16 DIAGNOSIS — L299 Pruritus, unspecified: Secondary | ICD-10-CM

## 2012-09-16 DIAGNOSIS — R21 Rash and other nonspecific skin eruption: Secondary | ICD-10-CM

## 2012-09-16 MED ORDER — PREDNISONE 20 MG PO TABS: ORAL_TABLET | ORAL | Status: DC

## 2012-09-16 NOTE — Progress Notes (Signed)
[facility] [address], [place] [address] [phone]  Date:  09/16/2012   Name:  [name]   DOB:  1990-12-25   MRN:  [id number]  PCP:  No primary provider on file.    Chief Complaint: Rash   History of Present Illness:  [name] is a 22 y.o. very pleasant female patient who presents with the following:  She noted onset of an itchy rash on her arms and legs about 10 days ago.  She first noted hives on her trunk.  Then the rash went ot her arms and legs.   She went to another UC about one week ago- she was given a shot of steroids, and zyrtec/ pepcid.  She did not take steroids by mouth.  The shot helped with her hives but not with the rest of the rash.  The hives are now resolved.   The rash is itchy- it is worse at night. She lives with her BF and daughter but they do not have symptoms of rash or itching. She has been on maternity leave- she devlered a healthy daughter 07/26/12.  She is not BF.  No menses yet but states there is no change of pregnancy now.   She is taking the pepcid and zyrtec right now.   Her new daughter is doing well, sleeping fairly well at night She is otherwise well, no other symtoms.    She has not taken her pain medication (c- section) in about 2 weeks Patient Active Problem List  Diagnosis  . S/P cesarean section    Past Medical History  Diagnosis Date  . H/O varicella   . Abnormal Pap smear     had colpo with abnormal paps afterward  . Asthma     childhood  . Headache     miagraine  . Anxiety   . Depression     Past Surgical History  Procedure Laterality Date  . Tympanostomy tube placement    . Wisdom teeth    . Colposcopy    . Cesarean section  07/26/2012    Procedure: CESAREAN SECTION;  Surgeon: Juluis Mire, MD;  Location: [facility];  Service: Obstetrics;  Laterality: N/A;    History  Substance Use Topics  . Smoking status: Never Smoker   . Smokeless tobacco: Never Used  . Alcohol Use: No     Family History  Problem Relation Age of Onset  . Asthma Mother   . Hyperlipidemia Mother   . Hypertension Mother   . Arthritis Mother   . Kidney Stones Father   . Stroke Father   . Cancer Maternal Aunt     breast  . Diabetes Paternal Aunt   . Cancer Paternal Uncle     leukemia  . Thyroid disease Maternal Grandmother   . Cancer Maternal Grandfather     lung  . Alcohol abuse Maternal Grandfather   . Hypertension Paternal Grandmother   . Diabetes Paternal Grandmother   . Alcohol abuse Paternal Grandfather     Allergies  Allergen Reactions  . Azithromycin Other (See Comments)    Severe stomach cramps    Medication list has been reviewed and updated.  Current Outpatient Prescriptions on File Prior to Visit  Medication Sig Dispense Refill  . ibuprofen (ADVIL,MOTRIN) 600 MG tablet Take 1 tablet (600 mg total) by mouth every 6 (six) hours.  30 tablet  1  . oxyCODONE-acetaminophen (PERCOCET/ROXICET) 5-325 MG per tablet Take 1-2 tablets by mouth  every 4 (four) hours as needed (moderate - severe pain).  30 tablet  0  . Prenatal Vit-Fe Fumarate-FA (PRENATAL MULTIVITAMIN) TABS Take 2 tablets by mouth daily.       No current facility-administered medications on file prior to visit.    Review of Systems:  As per HPI- otherwise negative  Physical Examination: Filed Vitals:   09/16/12 0950  BP: 122/72  Pulse: 87  Temp: 98.1 F (36.7 C)  Resp: 16   Filed Vitals:   09/16/12 0950  Height: 5\' 5"  (1.651 m)  Weight: 171 lb (77.565 kg)   Body mass index is 28.46 kg/(m^2). Ideal Body Weight: Weight in (lb) to have BMI = 25: 149.9  GEN: WDWN, NAD, Non-toxic, A & O x 3, looks well HEENT: Atraumatic, Normocephalic. Neck supple. No masses, No LAD.  Bilateral TM wnl, oropharynx normal.  PEERL,EOMI.   No oral lesions Ears and Nose: No external deformity. CV: RRR, No M/G/R. No JVD. No thrill. No extra heart sounds. PULM: CTA B, no wheezes, crackles, rhonchi. No retractions. No  resp. distress. No accessory muscle use EXTR: No c/c/e NEURO Normal gait.  PSYCH: Normally interactive. Conversant. Not depressed or anxious appearing.  Calm demeanor.  She has a fine, puritic rash on her forearms and inner legs at the knee area.  No rash in between fingers or at her waistline.  The rash is excoriated in areas.  No hives, no swelling, no angioedema   Assessment and Plan: Itching - Plan: predniSONE (DELTASONE) 20 MG tablet  Rash and nonspecific skin eruption - Plan: predniSONE (DELTASONE) 20 MG tablet  Rash and itching of uncertain etiology.  May be stress related.  Will treat with oral steroids, follow- up if not better.  See patient instructions for more details.     Abbe Amsterdam, MD

## 2012-09-16 NOTE — Patient Instructions (Addendum)
We are going to try a longer course of steroid for your rash.  Keep me updated regarding your progress- if you are not better we can send you to dermatology, do a biopsy, and/ or try cream for scabies.  Let me know if you get worse or are not better in one week

## 2012-10-13 ENCOUNTER — Ambulatory Visit: Payer: BC Managed Care – PPO | Admitting: Physician Assistant

## 2013-10-13 IMAGING — US US ABDOMEN COMPLETE
1 series · 14 of 25 positions shown · non-contrast
Comparison: Upper GI of 06/09/2007

CLINICAL DATA: Nausea for several months

COMPLETE ABDOMINAL ULTRASOUND

[Series 1: us abdomen complete · 0.21mm/px · 14 of 59 slices shown]
[im 1/59]
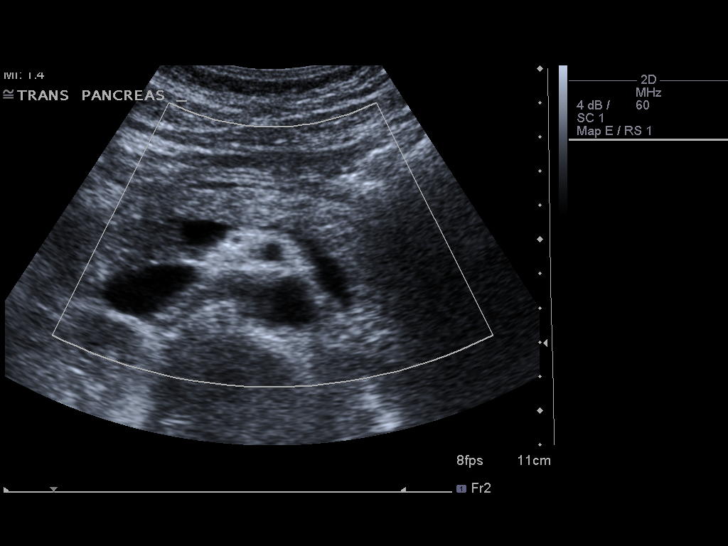
[im 5/59]
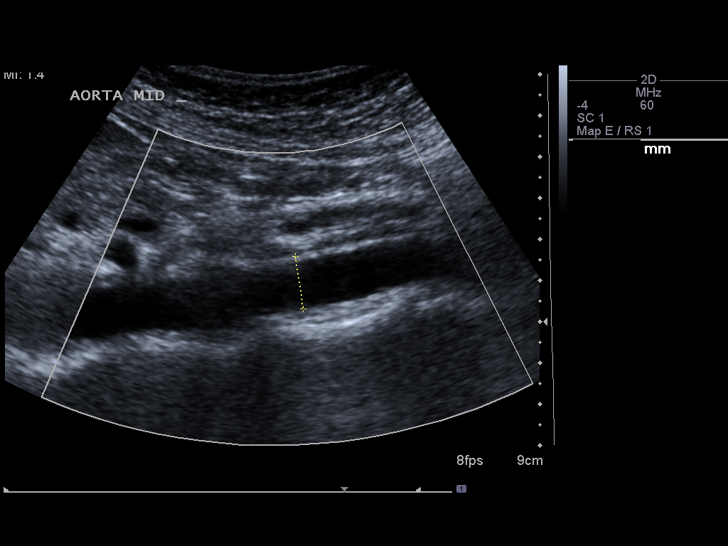
[im 10/59]
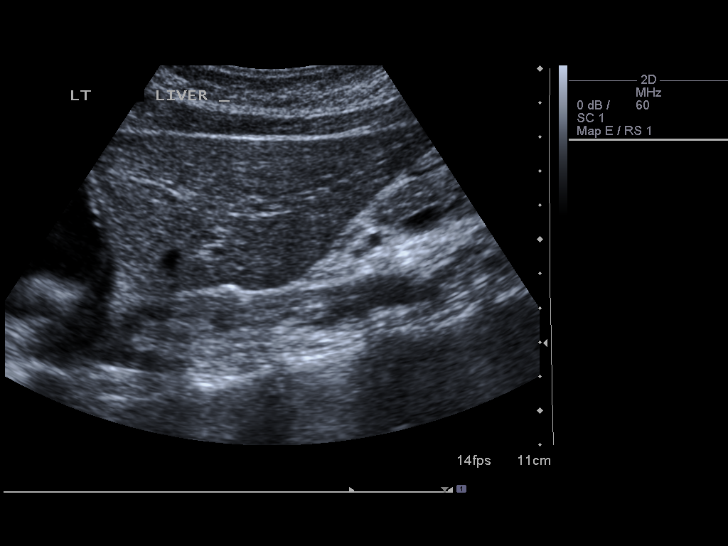
[im 15/59]
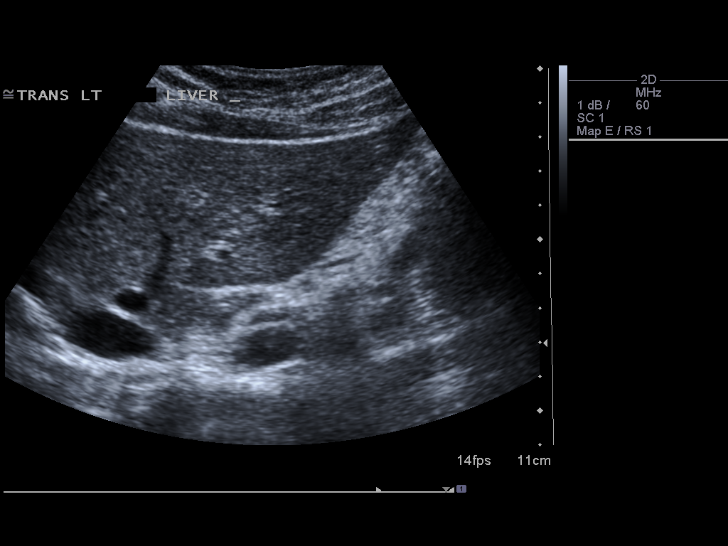
[im 20/59]
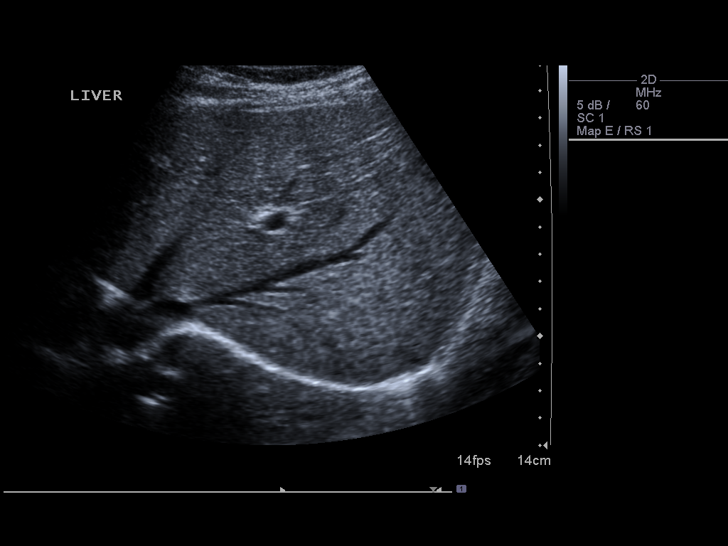
[im 22/59]
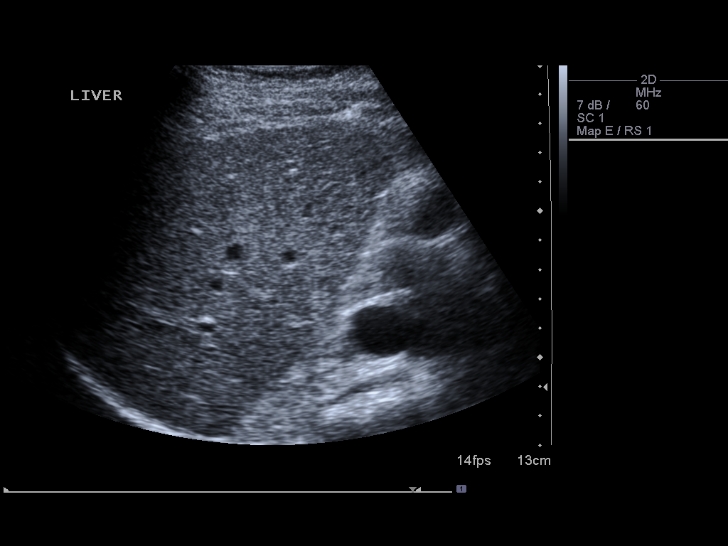
[im 27/59]
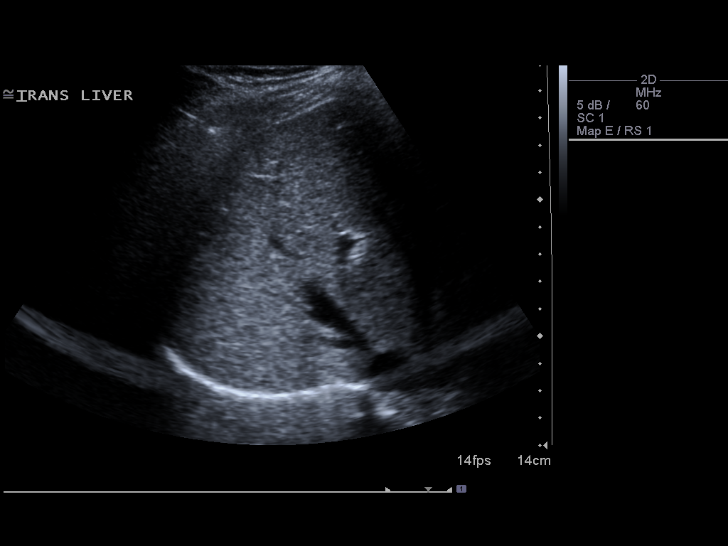
[im 32/59]
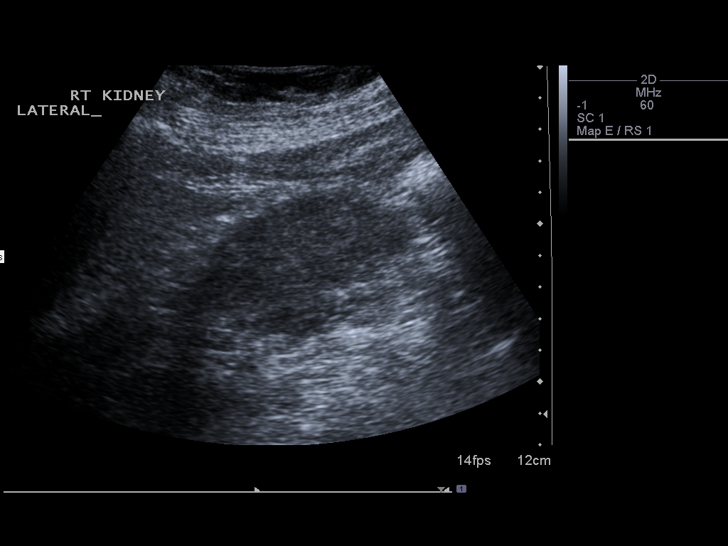
[im 37/59]
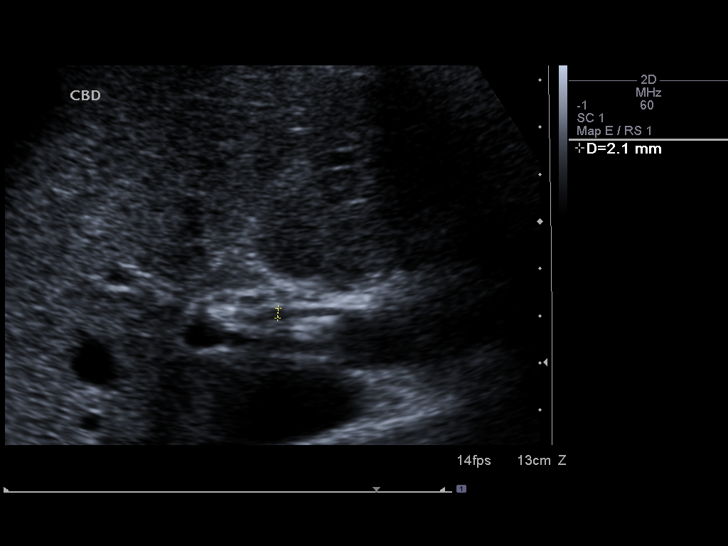
[im 39/59]
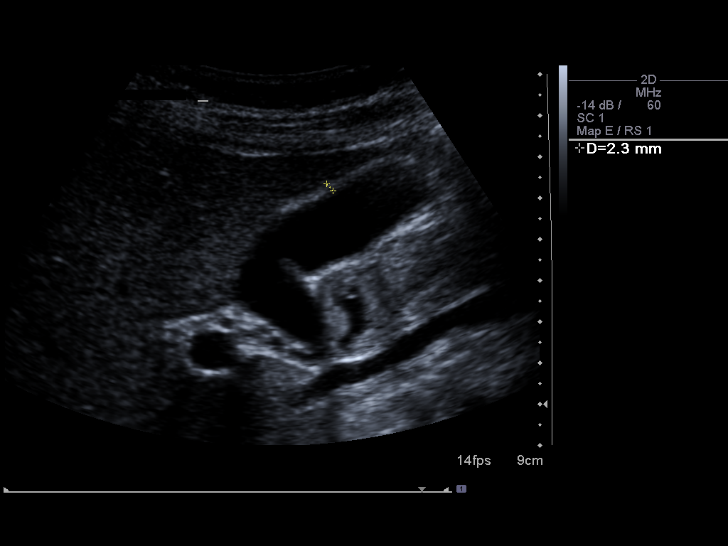
[im 44/59]
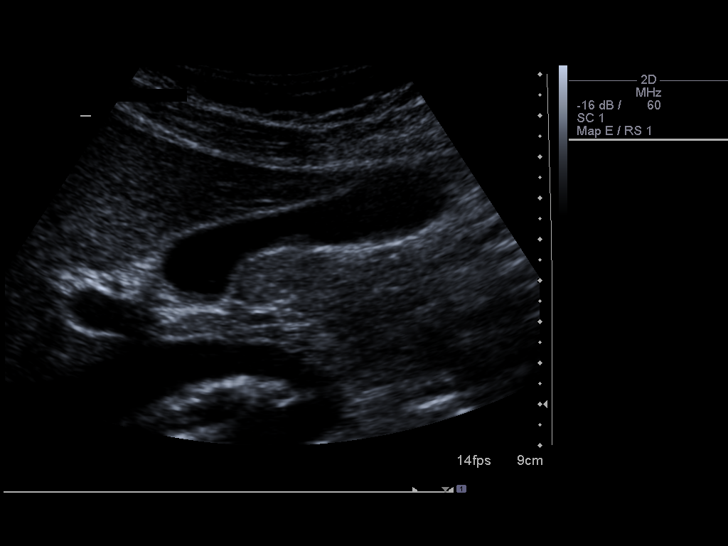
[im 49/59]
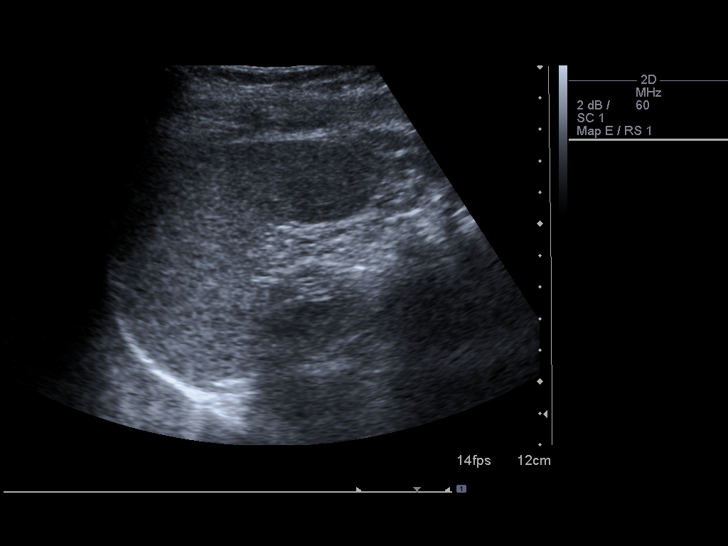
[im 54/59]
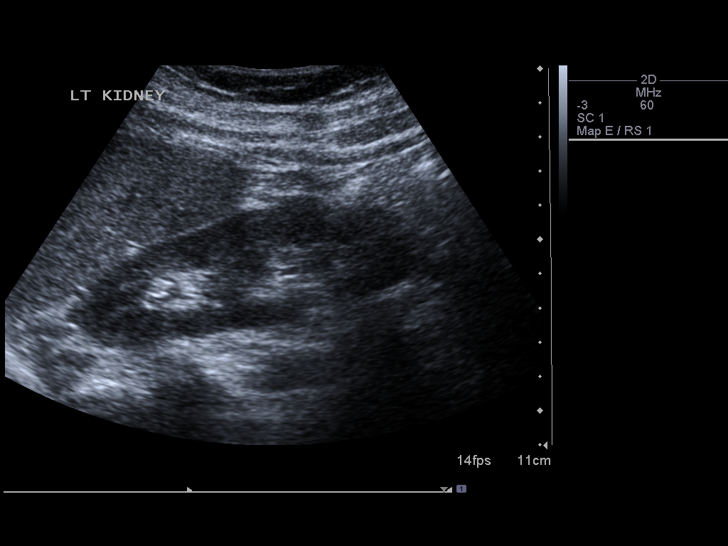
[im 59/59]
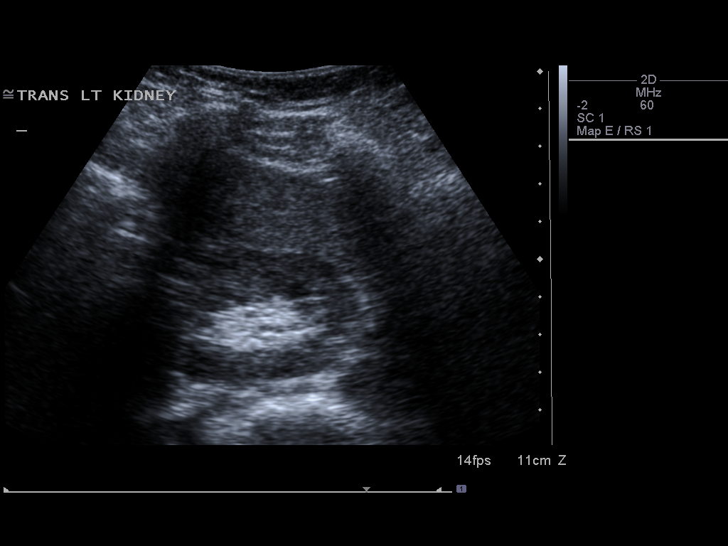

[14 of 25 positions shown; findings below may reference images not displayed]

FINDINGS: Gallbladder:  The gallbladder is visualized and no gallstones are
noted.  There is no pain over the gallbladder with compression.

Common bile duct:  The common bile duct is normal measuring 2.1 mm
in diameter.

Liver:  The liver has a normal echogenic pattern.  No ductal
dilatation is seen.

IVC:  Appears normal.

Pancreas:  No focal abnormality seen.

Spleen:  The spleen is normal measuring 7.6 cm sagittally.

Right Kidney:  No hydronephrosis is seen.  The right kidney
measures 11.6 cm sagittally.

Left Kidney:  No hydronephrosis is noted.  The left kidney measures
10.5 cm.

Abdominal aorta:  The abdominal aorta is normal in caliber.
IMPRESSION: Negative abdominal ultrasound.

## 2016-01-13 ENCOUNTER — Encounter (HOSPITAL_COMMUNITY): Payer: Self-pay

## 2016-01-13 ENCOUNTER — Emergency Department (HOSPITAL_COMMUNITY)
Admission: EM | Admit: 2016-01-13 | Discharge: 2016-01-14 | Disposition: A | Discharge status: Home / Self Care | Payer: Managed Care, Other (non HMO) | Attending: Emergency Medicine | Admitting: Emergency Medicine

## 2016-01-13 DIAGNOSIS — L259 Unspecified contact dermatitis, unspecified cause: Secondary | ICD-10-CM | POA: Insufficient documentation

## 2016-01-13 DIAGNOSIS — J45909 Unspecified asthma, uncomplicated: Secondary | ICD-10-CM | POA: Insufficient documentation

## 2016-01-13 DIAGNOSIS — F329 Major depressive disorder, single episode, unspecified: Secondary | ICD-10-CM | POA: Insufficient documentation

## 2016-01-13 DIAGNOSIS — R112 Nausea with vomiting, unspecified: Secondary | ICD-10-CM | POA: Diagnosis present

## 2016-01-13 DIAGNOSIS — Z791 Long term (current) use of non-steroidal anti-inflammatories (NSAID): Secondary | ICD-10-CM | POA: Insufficient documentation

## 2016-01-13 DIAGNOSIS — R197 Diarrhea, unspecified: Secondary | ICD-10-CM | POA: Insufficient documentation

## 2016-01-13 NOTE — ED Notes (Signed)
Started having nausea, vomiting, and diarrhea around 3 hours ago.  Think it may be food poisoning related to something at [employer] per pt.

## 2016-01-13 NOTE — ED Provider Notes (Signed)
By signing my name below, I, [facility] Center[facility]arrissa Maxwell, attest that this documentation has been prepared under the direction and in the presence of [name], DO. Electronically Signed: Randell PatientMarrissa Maxwell, ED Scribe. 01/14/2016. 12:11 AM.  TIME SEEN: 12:02 AM  CHIEF COMPLAINT: Emesis  HPI: [name] is a 25 y.o. female who presents to the Emergency Department complaining of intermittent, moderate, nausea, vomiting and diarrhea onset 4 hours ago. She reports associated nausea, vomiting x15, diarrhea x8, and abdominal cramping today. She notes an hx of cesarean section. Denies recent sick contact with people with similar symptoms. Denies recent international travel, antibiotic use, hospitalization. Denies fevers, vaginal bleeding, vaginal discharge, hematuria, dysuria.  Pt also here for a diffuse pruritic rash. She states that [facility] after coming into contact with poison ivy 6 days ago where she received a steroid injection and was advised to take Benadryl. She notes that the rash has spread but has decreased in pruritis since she was first evaluated despite taking Benadryl.  Per pt, she states that she has performed some yard work where poison ivy is present and that she recently got a new puppy but reports that she has been around dogs in the past without symptoms.  Denies tick bites, recents changes in soaps, lotions, and detergents.  No other new medications.  ROS: See HPI Constitutional: no fever  Eyes: no drainage  ENT: no runny nose   Cardiovascular:  no chest pain  Resp: no SOB  GI:  vomiting GU: no dysuria Integumentary:  rash  Allergy: no hives  Musculoskeletal: no leg swelling  Neurological: no slurred speech ROS otherwise negative  PAST MEDICAL HISTORY/PAST SURGICAL HISTORY:  Past Medical History  Diagnosis Date  . H/O varicella   . Abnormal Pap smear     had colpo with abnormal paps afterward  . Asthma     childhood  .  Headache(784.0)     miagraine  . Anxiety   . Depression     MEDICATIONS:  Prior to Admission medications   Medication Sig Start Date End Date Taking? Authorizing Provider  cetirizine (ZYRTEC) 10 MG tablet Take 10 mg by mouth daily.    Historical Provider, MD  famotidine (PEPCID) 10 MG tablet Take 10 mg by mouth 2 (two) times daily.    Historical Provider, MD  ibuprofen (ADVIL,MOTRIN) 600 MG tablet Take 1 tablet (600 mg total) by mouth every 6 (six) hours. 07/29/12   Julio Sicksarol Curtis, NP  oxyCODONE-acetaminophen (PERCOCET/ROXICET) 5-325 MG per tablet Take 1-2 tablets by mouth every 4 (four) hours as needed (moderate - severe pain). 07/29/12   Julio Sicksarol Curtis, NP  predniSONE (DELTASONE) 20 MG tablet Take 2 pills a day for 6 day, then 1 pill a day for 6 days, then 1/2 pill a day for 2 days 09/16/12   Pearline CablesJessica C Copland, MD  Prenatal Vit-Fe Fumarate-FA (PRENATAL MULTIVITAMIN) TABS Take 2 tablets by mouth daily.    Historical Provider, MD    ALLERGIES:  Allergies  Allergen Reactions  . Azithromycin Other (See Comments)    Severe stomach cramps    SOCIAL HISTORY:  Social History  Substance Use Topics  . Smoking status: Never Smoker   . Smokeless tobacco: Never Used  . Alcohol Use: No    FAMILY HISTORY: Family History  Problem Relation Age of Onset  . Asthma Mother   . Hyperlipidemia Mother   . Hypertension Mother   . Arthritis Mother   .  Kidney Stones Father   . Stroke Father   . Cancer Maternal Aunt     breast  . Diabetes Paternal Aunt   . Cancer Paternal Uncle     leukemia  . Thyroid disease Maternal Grandmother   . Cancer Maternal Grandfather     lung  . Alcohol abuse Maternal Grandfather   . Hypertension Paternal Grandmother   . Diabetes Paternal Grandmother   . Alcohol abuse Paternal Grandfather     EXAM: BP 130/59 mmHg  Pulse 89  Temp(Src) 98.3 F (36.8 C) (Oral)  Resp 18  Ht  (1.651 m)  Wt 185 lb (83.915 kg)  BMI 30.79 kg/m2  SpO2 100%  LMP  01/06/2016 CONSTITUTIONAL: Alert and oriented and responds appropriately to questions. Well-appearing; well-nourished HEAD: Normocephalic EYES: Conjunctivae clear, PERRL ENT: normal nose; no rhinorrhea; moist mucous membranes NECK: Supple, no meningismus, no LAD  CARD: RRR; S1 and S2 appreciated; no murmurs, no clicks, no rubs, no gallops RESP: Normal chest excursion without splinting or tachypnea; breath sounds clear and equal bilaterally; no wheezes, no rhonchi, no rales, no hypoxia or respiratory distress, speaking full sentences ABD/GI: Normal bowel sounds; non-distended; soft, non-tender, no rebound, no guarding, no peritoneal signs, no tenderness at McBurney's point, negative Murphy sign BACK:  The back appears normal and is non-tender to palpation, there is no CVA tenderness EXT: Normal ROM in all joints; non-tender to palpation; no edema; normal capillary refill; no cyanosis, no calf tenderness or swelling    SKIN: Normal color for age and race; warm; diffuse scattered, erythematous, eczematous rash without surrounding warmth, induration, fluctuance or drainage. No rash to palms, soles, or mouth. No petechiae or purpura. No blisters or  desquamation. No hives. NEURO: Moves all extremities equally, sensation to light touch intact diffusely, cranial nerves II through XII intact PSYCH: The patient's mood and manner are appropriate. Grooming and personal hygiene are appropriate.  MEDICAL DECISION MAKING: Patient here with nausea, vomiting or diarrhea. States she thinks she ate something bad at [employer] earlier today. Abdominal exam is completely benign. Discussed with patient that this could be food poisoning versus gastroenteritis. Doubt appendicitis, colitis, diverticulitis, bowel obstruction, perforation. I do not feel she needs abdominal imaging. States she has had multiple episodes of vomiting and diarrhea. Does not appear significantly dehydrated on exam but will check labs, urinalysis,  urine pregnancy test today.  We'll treat symptomatically with Bentyl, Zofran, fluids, Imodium.   Patient also complaining of diffuse pruritic rash. Looks like contact dermatitis. No sign of any life-threatening rash. Anticipate discharge home on steroid taper.  ED PROGRESS: Patient reports feeling much better. Abdominal exam is still completely benign. Negative Murphy sign and nontender at McBurney's point. Drinking without difficulty. No further vomiting or diarrhea. Her rash appears to be slightly improved as well. Will discharge on steroid taper. We'll discharge with Zofran, Bentyl, Imodium to take as needed. Discussed with her return precautions. She verbalized understanding and is comfortable with this plan.   At this time, I do not feel there is any life-threatening condition present. I have reviewed and discussed all results (EKG, imaging, lab, urine as appropriate), exam findings with patient. I have reviewed nursing notes and appropriate previous records.  I feel the patient is safe to be discharged home without further emergent workup. Discussed usual and customary return precautions. Patient and family (if present) verbalize understanding and are comfortable with this plan.  Patient will follow-up with their primary care provider. If they do not have a primary care  provider, information for follow-up has been provided to them. All questions have been answered.      I personally performed the services described in this documentation, which was scribed in my presence. The recorded information has been reviewed and is accurate.     Layla MawKristen N Hae Ahlers, DO 01/14/16 0140

## 2016-01-14 LAB — COMPREHENSIVE METABOLIC PANEL
ALK PHOS: 62 U/L (ref 38–126)
ALT: 27 U/L (ref 14–54)
ANION GAP: 7 (ref 5–15)
AST: 25 U/L (ref 15–41)
Albumin: 4.4 g/dL (ref 3.5–5.0)
BUN: 16 mg/dL (ref 6–20)
CALCIUM: 8.5 mg/dL — AB (ref 8.9–10.3)
CO2: 26 mmol/L (ref 22–32)
CREATININE: 0.86 mg/dL (ref 0.44–1.00)
Chloride: 105 mmol/L (ref 101–111)
Glucose, Bld: 124 mg/dL — ABNORMAL HIGH (ref 65–99)
Potassium: 3.7 mmol/L (ref 3.5–5.1)
SODIUM: 138 mmol/L (ref 135–145)
TOTAL PROTEIN: 7.1 g/dL (ref 6.5–8.1)
Total Bilirubin: 0.7 mg/dL (ref 0.3–1.2)

## 2016-01-14 LAB — URINALYSIS, ROUTINE W REFLEX MICROSCOPIC
Bilirubin Urine: NEGATIVE
Glucose, UA: NEGATIVE mg/dL
HGB URINE DIPSTICK: NEGATIVE
Ketones, ur: NEGATIVE mg/dL
Leukocytes, UA: NEGATIVE
Nitrite: NEGATIVE
PROTEIN: NEGATIVE mg/dL
Specific Gravity, Urine: >1.03 — ABNORMAL HIGH (ref 1.005–1.030)
pH: 5.5 (ref 5.0–8.0)

## 2016-01-14 LAB — PREGNANCY, URINE: PREG TEST UR: NEGATIVE

## 2016-01-14 LAB — CBC WITH DIFFERENTIAL/PLATELET
Basophils Absolute: 0 10*3/uL (ref 0.0–0.1)
Basophils Relative: 0 %
EOS ABS: 0.1 10*3/uL (ref 0.0–0.7)
EOS PCT: 0 %
HCT: 41.9 % (ref 36.0–46.0)
HEMOGLOBIN: 14.4 g/dL (ref 12.0–15.0)
LYMPHS ABS: 1.3 10*3/uL (ref 0.7–4.0)
LYMPHS PCT: 7 %
MCH: 29.8 pg (ref 26.0–34.0)
MCHC: 34.4 g/dL (ref 30.0–36.0)
MCV: 86.7 fL (ref 78.0–100.0)
MONOS PCT: 8 %
Monocytes Absolute: 1.4 10*3/uL — ABNORMAL HIGH (ref 0.1–1.0)
NEUTROS PCT: 85 %
Neutro Abs: 15.7 10*3/uL — ABNORMAL HIGH (ref 1.7–7.7)
Platelets: 195 10*3/uL (ref 150–400)
RBC: 4.83 MIL/uL (ref 3.87–5.11)
RDW: 12.8 % (ref 11.5–15.5)
WBC: 18.5 10*3/uL — AB (ref 4.0–10.5)

## 2016-01-14 LAB — LIPASE, BLOOD: LIPASE: 14 U/L (ref 11–51)

## 2016-01-14 MED ORDER — LOPERAMIDE HCL 2 MG PO CAPS
4.0000 mg | ORAL_CAPSULE | Freq: Once | ORAL | Status: AC
  Administered 2016-01-14: 4 mg via ORAL
  Filled 2016-01-14: qty 2

## 2016-01-14 MED ORDER — DIPHENHYDRAMINE HCL 50 MG/ML IJ SOLN
50.0000 mg | Freq: Once | INTRAMUSCULAR | Status: AC
  Administered 2016-01-14: 50 mg via INTRAVENOUS
  Filled 2016-01-14: qty 1

## 2016-01-14 MED ORDER — LOPERAMIDE HCL 2 MG PO CAPS: 2.0000 mg | ORAL_CAPSULE | Freq: Four times a day (QID) | ORAL | Status: DC | PRN

## 2016-01-14 MED ORDER — DICYCLOMINE HCL 20 MG PO TABS: 20.0000 mg | ORAL_TABLET | Freq: Three times a day (TID) | ORAL | Status: DC

## 2016-01-14 MED ORDER — METHYLPREDNISOLONE SODIUM SUCC 125 MG IJ SOLR
125.0000 mg | Freq: Once | INTRAMUSCULAR | Status: AC
  Administered 2016-01-14: 125 mg via INTRAVENOUS
  Filled 2016-01-14: qty 2

## 2016-01-14 MED ORDER — DICYCLOMINE HCL 10 MG/ML IM SOLN
20.0000 mg | Freq: Once | INTRAMUSCULAR | Status: AC
  Administered 2016-01-14: 20 mg via INTRAMUSCULAR
  Filled 2016-01-14: qty 2

## 2016-01-14 MED ORDER — ONDANSETRON 4 MG PO TBDP: 4.0000 mg | ORAL_TABLET | Freq: Three times a day (TID) | ORAL | Status: DC | PRN

## 2016-01-14 MED ORDER — ONDANSETRON HCL 4 MG/2ML IJ SOLN
4.0000 mg | Freq: Once | INTRAMUSCULAR | Status: AC
  Administered 2016-01-14: 4 mg via INTRAVENOUS
  Filled 2016-01-14: qty 2

## 2016-01-14 MED ORDER — PREDNISONE 10 MG (21) PO TBPK: 10.0000 mg | ORAL_TABLET | Freq: Every day | ORAL | Status: DC

## 2016-01-14 MED ORDER — SODIUM CHLORIDE 0.9 % IV BOLUS (SEPSIS)
1000.0000 mL | Freq: Once | INTRAVENOUS | Status: AC
  Administered 2016-01-14: 1000 mL via INTRAVENOUS

## 2016-01-14 NOTE — Discharge Instructions (Signed)
Contact Dermatitis °Dermatitis is redness, soreness, and swelling (inflammation) of the skin. Contact dermatitis is a reaction to certain substances that touch the skin. There are two types of contact dermatitis:  °· Irritant contact dermatitis. This type is caused by something that irritates your skin, such as dry hands from washing them too much. This type does not require previous exposure to the substance for a reaction to occur. This type is more common. °· Allergic contact dermatitis. This type is caused by a substance that you are allergic to, such as a nickel allergy or poison ivy. This type only occurs if you have been exposed to the substance (allergen) before. Upon a repeat exposure, your body reacts to the substance. This type is less common. °CAUSES  °Many different substances can cause contact dermatitis. Irritant contact dermatitis is most commonly caused by exposure to:  °· Makeup.   °· Soaps.   °· Detergents.   °· Bleaches.   °· Acids.   °· Metal salts, such as nickel.   °Allergic contact dermatitis is most commonly caused by exposure to:  °· Poisonous plants.   °· Chemicals.   °· Jewelry.   °· Latex.   °· Medicines.   °· Preservatives in products, such as clothing.   °RISK FACTORS °This condition is more likely to develop in:  °· People who have jobs that expose them to irritants or allergens. °· People who have certain medical conditions, such as asthma or eczema.   °SYMPTOMS  °Symptoms of this condition may occur anywhere on your body where the irritant has touched you or is touched by you. Symptoms include: °· Dryness or flaking.   °· Redness.   °· Cracks.   °· Itching.   °· Pain or a burning feeling.   °· Blisters. °· Drainage of small amounts of blood or clear fluid from skin cracks. °With allergic contact dermatitis, there may also be swelling in areas such as the eyelids, mouth, or genitals.  °DIAGNOSIS  °This condition is diagnosed with a medical history and physical exam. A patch skin test  may be performed to help determine the cause. If the condition is related to your job, you may need to see an occupational medicine specialist. °TREATMENT °Treatment for this condition includes figuring out what caused the reaction and protecting your skin from further contact. Treatment may also include:  °· Steroid creams or ointments. Oral steroid medicines may be needed in more severe cases. °· Antibiotics or antibacterial ointments, if a skin infection is present. °· Antihistamine lotion or an antihistamine taken by mouth to ease itching. °· A bandage (dressing). °HOME CARE INSTRUCTIONS °Skin Care  °· Moisturize your skin as needed.   °· Apply cool compresses to the affected areas. °· Try taking a bath with: °¨ Epsom salts. Follow the instructions on the packaging. You can get these at your local pharmacy or grocery store. °¨ Baking soda. Pour a small amount into the bath as directed by your health care provider. °¨ Colloidal oatmeal. Follow the instructions on the packaging. You can get this at your local pharmacy or grocery store. °· Try applying baking soda paste to your skin. Stir water into baking soda until it reaches a paste-like consistency. °· Do not scratch your skin. °· Bathe less frequently, such as every other day. °· Bathe in lukewarm water. Avoid using hot water. °Medicines  °· Take or apply over-the-counter and prescription medicines only as told by your health care provider.   °· If you were prescribed an antibiotic medicine, take or apply your antibiotic as told by your health care provider. Do not stop using the   antibiotic even if your condition starts to improve. General Instructions  Keep all follow-up visits as told by your health care provider. This is important.  Avoid the substance that caused your reaction. If you do not know what caused it, keep a journal to try to track what caused it. Write down:  What you eat.  What cosmetic products you use.  What you drink.  What  you wear in the affected area. This includes jewelry.  If you were given a dressing, take care of it as told by your health care provider. This includes when to change and remove it. SEEK MEDICAL CARE IF:   Your condition does not improve with treatment.  Your condition gets worse.  You have signs of infection such as swelling, tenderness, redness, soreness, or warmth in the affected area.  You have a fever.  You have new symptoms. SEEK IMMEDIATE MEDICAL CARE IF:   You have a severe headache, neck pain, or neck stiffness.  You vomit.  You feel very sleepy.  You notice red streaks coming from the affected area.  Your bone or joint underneath the affected area becomes painful after the skin has healed.  The affected area turns darker.  You have difficulty breathing.   This information is not intended to replace advice given to you by your health care provider. Make sure you discuss any questions you have with your health care provider.   Document Released: 06/27/2000 Document Revised: 03/21/2015 Document Reviewed: 11/15/2014 Elsevier Interactive Patient Education 2016 [employer] Inc[employer]  Diarrhea Diarrhea is frequent loose and watery bowel movements. It can cause you to feel weak and dehydrated. Dehydration can cause you to become tired and thirsty, have a dry mouth, and have decreased urination that often is dark yellow. Diarrhea is a sign of another problem, most often an infection that will not last long. In most cases, diarrhea typically lasts 2-3 days. However, it can last longer if it is a sign of something more serious. It is important to treat your diarrhea as directed by your caregiver to lessen or prevent future episodes of diarrhea. CAUSES  Some common causes include:  Gastrointestinal infections caused by viruses, bacteria, or parasites.  Food poisoning or food allergies.  Certain medicines, such as antibiotics, chemotherapy, and laxatives.  Artificial sweeteners  and fructose.  Digestive disorders. HOME CARE INSTRUCTIONS  Ensure adequate fluid intake (hydration): Have 1 cup (8 oz) of fluid for each diarrhea episode. Avoid fluids that contain simple sugars or sports drinks, fruit juices, whole milk products, and sodas. Your urine should be clear or pale yellow if you are drinking enough fluids. Hydrate with an oral rehydration solution that you can purchase at pharmacies, retail stores, and online. You can prepare an oral rehydration solution at home by mixing the following ingredients together:   - tsp table salt.   tsp baking soda.   tsp salt substitute containing potassium chloride.  1  tablespoons sugar.  1 L (34 oz) of water.  Certain foods and beverages may increase the speed at which food moves through the gastrointestinal (GI) tract. These foods and beverages should be avoided and include:  Caffeinated and alcoholic beverages.  High-fiber foods, such as raw fruits and vegetables, nuts, seeds, and whole grain breads and cereals.  Foods and beverages sweetened with sugar alcohols, such as xylitol, sorbitol, and mannitol.  Some foods may be well tolerated and may help thicken stool including:  Starchy foods, such as rice, toast, pasta, low-sugar cereal, oatmeal, grits,  baked potatoes, crackers, and bagels.  Bananas.  Applesauce.  Add probiotic-rich foods to help increase healthy bacteria in the GI tract, such as yogurt and fermented milk products.  Wash your hands well after each diarrhea episode.  Only take over-the-counter or prescription medicines as directed by your caregiver.  Take a warm bath to relieve any burning or pain from frequent diarrhea episodes. SEEK IMMEDIATE MEDICAL CARE IF:   You are unable to keep fluids down.  You have persistent vomiting.  You have blood in your stool, or your stools are black and tarry.  You do not urinate in 6-8 hours, or there is only a small amount of very dark urine.  You have  abdominal pain that increases or localizes.  You have weakness, dizziness, confusion, or light-headedness.  You have a severe headache.  Your diarrhea gets worse or does not get better.  You have a fever or persistent symptoms for more than 2-3 days.  You have a fever and your symptoms suddenly get worse. MAKE SURE YOU:   Understand these instructions.  Will watch your condition.  Will get help right away if you are not doing well or get worse.   This information is not intended to replace advice given to you by your health care provider. Make sure you discuss any questions you have with your health care provider.   Document Released: 06/20/2002 Document Revised: 07/21/2014 Document Reviewed: 03/07/2012 Elsevier Interactive Patient Education 2016 [employer]  Food Choices to Help Relieve Diarrhea, Adult When you have diarrhea, the foods you eat and your eating habits are very important. Choosing the right foods and drinks can help relieve diarrhea. Also, because diarrhea can last up to 7 days, you need to replace lost fluids and electrolytes (such as sodium, potassium, and chloride) in order to help prevent dehydration.  WHAT GENERAL GUIDELINES DO I NEED TO FOLLOW?  Slowly drink 1 cup (8 oz) of fluid for each episode of diarrhea. If you are getting enough fluid, your urine will be clear or pale yellow.  Eat starchy foods. Some good choices include white rice, white toast, pasta, low-fiber cereal, baked potatoes (without the skin), saltine crackers, and bagels.  Avoid large servings of any cooked vegetables.  Limit fruit to two servings per day. A serving is  cup or 1 small piece.  Choose foods with less than 2 g of fiber per serving.  Limit fats to less than 8 tsp (38 g) per day.  Avoid fried foods.  Eat foods that have probiotics in them. Probiotics can be found in certain dairy products.  Avoid foods and beverages that may increase the speed at which food moves  through the stomach and intestines (gastrointestinal tract). Things to avoid include:  High-fiber foods, such as dried fruit, raw fruits and vegetables, nuts, seeds, and whole grain foods.  Spicy foods and high-fat foods.  Foods and beverages sweetened with high-fructose corn syrup, honey, or sugar alcohols such as xylitol, sorbitol, and mannitol. WHAT FOODS ARE RECOMMENDED? Grains White rice. White, [place], or pita breads (fresh or toasted), including plain rolls, buns, or bagels. White pasta. Saltine, soda, or graham crackers. Pretzels. Low-fiber cereal. Cooked cereals made with water (such as cornmeal, farina, or cream cereals). Plain muffins. Matzo. Melba toast. Zwieback.  Vegetables Potatoes (without the skin). Strained tomato and vegetable juices. Most well-cooked and canned vegetables without seeds. Tender lettuce. Fruits Cooked or canned applesauce, apricots, cherries, fruit cocktail, grapefruit, peaches, pears, or plums. Fresh bananas, apples without skin,  cherries, grapes, cantaloupe, grapefruit, peaches, oranges, or plums.  Meat and Other Protein Products Baked or boiled chicken. Eggs. Tofu. Fish. Seafood. Smooth peanut butter. Ground or well-cooked tender beef, ham, veal, lamb, pork, or poultry.  Dairy Plain yogurt, kefir, and unsweetened liquid yogurt. Lactose-free milk, buttermilk, or soy milk. Plain hard cheese. Beverages Sport drinks. Clear broths. Diluted fruit juices (except prune). Regular, caffeine-free sodas such as ginger ale. Water. Decaffeinated teas. Oral rehydration solutions. Sugar-free beverages not sweetened with sugar alcohols. Other Bouillon, broth, or soups made from recommended foods.  The items listed above may not be a complete list of recommended foods or beverages. Contact your dietitian for more options. WHAT FOODS ARE NOT RECOMMENDED? Grains Whole grain, whole wheat, bran, or rye breads, rolls, pastas, crackers, and cereals. Wild or brown rice. Cereals  that contain more than 2 g of fiber per serving. Corn tortillas or taco shells. Cooked or dry oatmeal. Granola. Popcorn. Vegetables Raw vegetables. Cabbage, broccoli, Brussels sprouts, artichokes, baked beans, beet greens, corn, kale, legumes, peas, sweet potatoes, and yams. Potato skins. Cooked spinach and cabbage. Fruits Dried fruit, including raisins and dates. Raw fruits. Stewed or dried prunes. Fresh apples with skin, apricots, mangoes, pears, raspberries, and strawberries.  Meat and Other Protein Products Chunky peanut butter. Nuts and seeds. Beans and lentils. Tomasa Blase.  Dairy High-fat cheeses. Milk, chocolate milk, and beverages made with milk, such as milk shakes. Cream. Ice cream. Sweets and Desserts Sweet rolls, doughnuts, and sweet breads. Pancakes and waffles. Fats and Oils Butter. Cream sauces. Margarine. Salad oils. Plain salad dressings. Olives. Avocados.  Beverages Caffeinated beverages (such as coffee, tea, soda, or energy drinks). Alcoholic beverages. Fruit juices with pulp. Prune juice. Soft drinks sweetened with high-fructose corn syrup or sugar alcohols. Other Coconut. Hot sauce. Chili powder. Mayonnaise. Gravy. Cream-based or milk-based soups.  The items listed above may not be a complete list of foods and beverages to avoid. Contact your dietitian for more information. WHAT SHOULD I DO IF I BECOME DEHYDRATED? Diarrhea can sometimes lead to dehydration. Signs of dehydration include dark urine and dry mouth and skin. If you think you are dehydrated, you should rehydrate with an oral rehydration solution. These solutions can be purchased at pharmacies, retail stores, or online.  Drink -1 cup (120-240 mL) of oral rehydration solution each time you have an episode of diarrhea. If drinking this amount makes your diarrhea worse, try drinking smaller amounts more often. For example, drink 1-3 tsp (5-15 mL) every 5-10 minutes.  A general rule for staying hydrated is to drink 1-2 L  of fluid per day. Talk to your health care provider about the specific amount you should be drinking each day. Drink enough fluids to keep your urine clear or pale yellow.   This information is not intended to replace advice given to you by your health care provider. Make sure you discuss any questions you have with your health care provider.   Document Released: 09/20/2003 Document Revised: 07/21/2014 Document Reviewed: 05/23/2013 Elsevier Interactive Patient Education 2016 Elsevier Inc.  Nausea and Vomiting Nausea is a sick feeling that often comes before throwing up (vomiting). Vomiting is a reflex where stomach contents come out of your mouth. Vomiting can cause severe loss of body fluids (dehydration). Children and elderly adults can become dehydrated quickly, especially if they also have diarrhea. Nausea and vomiting are symptoms of a condition or disease. It is important to find the cause of your symptoms. CAUSES   Direct irritation of the stomach  lining. This irritation can result from increased acid production (gastroesophageal reflux disease), infection, food poisoning, taking certain medicines (such as nonsteroidal anti-inflammatory drugs), alcohol use, or tobacco use.  Signals from the brain.These signals could be caused by a headache, heat exposure, an inner ear disturbance, increased pressure in the brain from injury, infection, a tumor, or a concussion, pain, emotional stimulus, or metabolic problems.  An obstruction in the gastrointestinal tract (bowel obstruction).  Illnesses such as diabetes, hepatitis, gallbladder problems, appendicitis, kidney problems, cancer, sepsis, atypical symptoms of a heart attack, or eating disorders.  Medical treatments such as chemotherapy and radiation.  Receiving medicine that makes you sleep (general anesthetic) during surgery. DIAGNOSIS Your caregiver may ask for tests to be done if the problems do not improve after a few days. Tests may  also be done if symptoms are severe or if the reason for the nausea and vomiting is not clear. Tests may include:  Urine tests.  Blood tests.  Stool tests.  Cultures (to look for evidence of infection).  X-rays or other imaging studies. Test results can help your caregiver make decisions about treatment or the need for additional tests. TREATMENT You need to stay well hydrated. Drink frequently but in small amounts.You may wish to drink water, sports drinks, clear broth, or eat frozen ice pops or gelatin dessert to help stay hydrated.When you eat, eating slowly may help prevent nausea.There are also some antinausea medicines that may help prevent nausea. HOME CARE INSTRUCTIONS   Take all medicine as directed by your caregiver.  If you do not have an appetite, do not force yourself to eat. However, you must continue to drink fluids.  If you have an appetite, eat a normal diet unless your caregiver tells you differently.  Eat a variety of complex carbohydrates (rice, wheat, potatoes, bread), lean meats, yogurt, fruits, and vegetables.  Avoid high-fat foods because they are more difficult to digest.  Drink enough water and fluids to keep your urine clear or pale yellow.  If you are dehydrated, ask your caregiver for specific rehydration instructions. Signs of dehydration may include:  Severe thirst.  Dry lips and mouth.  Dizziness.  Dark urine.  Decreasing urine frequency and amount.  Confusion.  Rapid breathing or pulse. SEEK IMMEDIATE MEDICAL CARE IF:   You have blood or brown flecks (like coffee grounds) in your vomit.  You have black or bloody stools.  You have a severe headache or stiff neck.  You are confused.  You have severe abdominal pain.  You have chest pain or trouble breathing.  You do not urinate at least once every 8 hours.  You develop cold or clammy skin.  You continue to vomit for longer than 24 to 48 hours.  You have a fever. MAKE  SURE YOU:   Understand these instructions.  Will watch your condition.  Will get help right away if you are not doing well or get worse.   This information is not intended to replace advice given to you by your health care provider. Make sure you discuss any questions you have with your health care provider.   Document Released: 06/30/2005 Document Revised: 09/22/2011 Document Reviewed: 11/27/2010 Elsevier Interactive Patient Education [employer] Inc.

## 2016-01-14 NOTE — ED Notes (Signed)
Pt was given ginger ale.  Pt is tolerating sips

## 2016-11-05 ENCOUNTER — Encounter (HOSPITAL_COMMUNITY): Payer: Self-pay | Admitting: Emergency Medicine

## 2016-11-05 ENCOUNTER — Emergency Department (HOSPITAL_COMMUNITY): Payer: Managed Care, Other (non HMO)

## 2016-11-05 ENCOUNTER — Emergency Department (HOSPITAL_COMMUNITY)
Admission: EM | Admit: 2016-11-05 | Discharge: 2016-11-05 | Disposition: A | Discharge status: Home / Self Care | Payer: Managed Care, Other (non HMO) | Attending: Emergency Medicine | Admitting: Emergency Medicine

## 2016-11-05 DIAGNOSIS — J189 Pneumonia, unspecified organism: Secondary | ICD-10-CM | POA: Diagnosis not present

## 2016-11-05 DIAGNOSIS — R509 Fever, unspecified: Secondary | ICD-10-CM | POA: Diagnosis present

## 2016-11-05 DIAGNOSIS — J45909 Unspecified asthma, uncomplicated: Secondary | ICD-10-CM | POA: Diagnosis not present

## 2016-11-05 DIAGNOSIS — R1031 Right lower quadrant pain: Secondary | ICD-10-CM | POA: Insufficient documentation

## 2016-11-05 LAB — URINALYSIS, ROUTINE W REFLEX MICROSCOPIC
Glucose, UA: 100 mg/dL — AB
Hgb urine dipstick: NEGATIVE
Ketones, ur: 15 mg/dL — AB
Nitrite: NEGATIVE
Protein, ur: NEGATIVE mg/dL
Specific Gravity, Urine: 1.025 (ref 1.005–1.030)
pH: 5.5 (ref 5.0–8.0)

## 2016-11-05 LAB — URINALYSIS, MICROSCOPIC (REFLEX)

## 2016-11-05 LAB — CBC WITH DIFFERENTIAL/PLATELET
Basophils Absolute: 0 10*3/uL (ref 0.0–0.1)
Basophils Relative: 0 %
Eosinophils Absolute: 0 10*3/uL (ref 0.0–0.7)
Eosinophils Relative: 0 %
HCT: 38.2 % (ref 36.0–46.0)
Hemoglobin: 13 g/dL (ref 12.0–15.0)
Lymphocytes Relative: 6 %
Lymphs Abs: 1.3 10*3/uL (ref 0.7–4.0)
MCH: 28.7 pg (ref 26.0–34.0)
MCHC: 34 g/dL (ref 30.0–36.0)
MCV: 84.3 fL (ref 78.0–100.0)
Monocytes Absolute: 1.1 10*3/uL — ABNORMAL HIGH (ref 0.1–1.0)
Monocytes Relative: 5 %
Neutro Abs: 19.4 10*3/uL — ABNORMAL HIGH (ref 1.7–7.7)
Neutrophils Relative %: 89 %
Platelets: 175 10*3/uL (ref 150–400)
RBC: 4.53 MIL/uL (ref 3.87–5.11)
RDW: 13 % (ref 11.5–15.5)
WBC: 21.8 10*3/uL — ABNORMAL HIGH (ref 4.0–10.5)

## 2016-11-05 LAB — COMPREHENSIVE METABOLIC PANEL
ALT: 15 U/L (ref 14–54)
AST: 18 U/L (ref 15–41)
Albumin: 3.9 g/dL (ref 3.5–5.0)
Alkaline Phosphatase: 49 U/L (ref 38–126)
Anion gap: 10 (ref 5–15)
BUN: 6 mg/dL (ref 6–20)
CO2: 21 mmol/L — ABNORMAL LOW (ref 22–32)
Calcium: 8.8 mg/dL — ABNORMAL LOW (ref 8.9–10.3)
Chloride: 105 mmol/L (ref 101–111)
Creatinine, Ser: 1.02 mg/dL — ABNORMAL HIGH (ref 0.44–1.00)
GFR calc Af Amer: >60 mL/min (ref >60)
GFR calc non Af Amer: >60 mL/min (ref >60)
Glucose, Bld: 129 mg/dL — ABNORMAL HIGH (ref 65–99)
Potassium: 3.4 mmol/L — ABNORMAL LOW (ref 3.5–5.1)
Sodium: 136 mmol/L (ref 135–145)
Total Bilirubin: 1.6 mg/dL — ABNORMAL HIGH (ref 0.3–1.2)
Total Protein: 7 g/dL (ref 6.5–8.1)

## 2016-11-05 LAB — INFLUENZA PANEL BY PCR (TYPE A & B)
Influenza A By PCR: NEGATIVE
Influenza B By PCR: NEGATIVE

## 2016-11-05 LAB — I-STAT CG4 LACTIC ACID, ED: Lactic Acid, Venous: 1.03 mmol/L (ref 0.5–1.9)

## 2016-11-05 LAB — I-STAT BETA HCG BLOOD, ED (MC, WL, AP ONLY): I-stat hCG, quantitative: <5 m[IU]/mL (ref <5)

## 2016-11-05 MED ORDER — LEVOFLOXACIN 750 MG PO TABS: 750.0000 mg | ORAL_TABLET | Freq: Every day | ORAL | 0 refills | Status: DC

## 2016-11-05 MED ORDER — ONDANSETRON HCL 4 MG PO TABS: 4.0000 mg | ORAL_TABLET | Freq: Four times a day (QID) | ORAL | 0 refills | Status: DC

## 2016-11-05 MED ORDER — ACETAMINOPHEN 325 MG PO TABS
ORAL_TABLET | ORAL | Status: AC
  Filled 2016-11-05: qty 2

## 2016-11-05 MED ORDER — ACETAMINOPHEN 325 MG PO TABS
650.0000 mg | ORAL_TABLET | Freq: Once | ORAL | Status: AC | PRN
  Administered 2016-11-05: 650 mg via ORAL

## 2016-11-05 MED ORDER — IOPAMIDOL (ISOVUE-300) INJECTION 61%
INTRAVENOUS | Status: AC
  Administered 2016-11-05: 100 mL
  Filled 2016-11-05: qty 100

## 2016-11-05 MED ORDER — SODIUM CHLORIDE 0.9 % IV BOLUS (SEPSIS)
1000.0000 mL | Freq: Once | INTRAVENOUS | Status: AC
  Administered 2016-11-05: 1000 mL via INTRAVENOUS

## 2016-11-05 MED ORDER — LEVOFLOXACIN 750 MG PO TABS
750.0000 mg | ORAL_TABLET | Freq: Once | ORAL | Status: AC
  Administered 2016-11-05: 750 mg via ORAL
  Filled 2016-11-05: qty 1

## 2016-11-05 MED ORDER — IBUPROFEN 400 MG PO TABS
600.0000 mg | ORAL_TABLET | Freq: Once | ORAL | Status: AC
  Administered 2016-11-05: 600 mg via ORAL
  Filled 2016-11-05: qty 1

## 2016-11-05 NOTE — ED Notes (Signed)
Called main lab to inquire about ua results.

## 2016-11-05 NOTE — Discharge Instructions (Signed)
Please read attached information. If you experience any new or worsening signs or symptoms please return to the emergency room for evaluation. Please follow-up with your primary care provider or specialist as discussed. Please use medication prescribed only as directed and discontinue taking if you have any concerning signs or symptoms.   °

## 2016-11-05 NOTE — ED Triage Notes (Signed)
Pt sts fever and N/V with body aches x 3 days

## 2016-11-05 NOTE — ED Notes (Signed)
Patient transported to X-ray 

## 2016-11-05 NOTE — ED Provider Notes (Signed)
[facility]-EMERGENCY DEPT Provider Note   CSN: [id number] Arrival date & time: 11/05/16  1026     History   Chief Complaint Chief Complaint  Patient presents with  . Vomiting  . Fever    HPI [name] is a 26 y.o. female.  HPI   26 year old female presents today with complaints of nausea vomiting and body aches.  Patient notes approximately 3 days ago she started to feel fatigue, this developed into body aches, minor sore throat, nonproductive cough.  She notes over the last 24 hours symptoms have dramatically worsened with the addition of vomiting and fever.  Patient notes that she has been taking DayQuil and NyQuil without improvement in her symptoms.  At the time of evaluation patient denies any chest pain or shortness of breath, abdominal pain, vaginal discharge, urinary changes, diarrhea, rash, or any other concerning signs or symptoms.  Patient denies any neck stiffness.  Patient notes that she was diagnosed with influenza in February and recovered without complication.  She notes a close friend had similar symptoms last week.  Attempts at p.o. this morning were unsuccessful.    Past Medical History:  Diagnosis Date  . Abnormal Pap smear    had colpo with abnormal paps afterward  . Anxiety   . Asthma    childhood  . Depression   . H/O varicella   . BJYNWGNF(621.3)    miagraine    Patient Active Problem List   Diagnosis Date Noted  . S/P cesarean section 07/26/2012    Class: Status post    Past Surgical History:  Procedure Laterality Date  . CESAREAN SECTION  07/26/2012   Procedure: CESAREAN SECTION;  Surgeon: Juluis Mire, MD;  Location: [facility];  Service: Obstetrics;  Laterality: N/A;  . COLPOSCOPY    . TYMPANOSTOMY TUBE PLACEMENT    . wisdom teeth      OB History    Gravida Para Term Preterm AB Living   0 0 1   SAB TAB Ectopic Multiple Live Births   0 0 0 0 1       Home Medications    Prior to Admission medications   Medication Sig Start  Date End Date Taking? Authorizing Provider  cetirizine (ZYRTEC) 10 MG tablet Take 10 mg by mouth daily.    Historical Provider, MD  dicyclomine (BENTYL) 20 MG tablet Take 1 tablet (20 mg total) by mouth 3 (three) times daily before meals. Take as needed for abdominal cramping 01/14/16   Kristen N Ward, DO  famotidine (PEPCID) 10 MG tablet Take 10 mg by mouth 2 (two) times daily.    Historical Provider, MD  levofloxacin (LEVAQUIN) 750 MG tablet Take 1 tablet (750 mg total) by mouth daily. 11/05/16   Eyvonne Mechanic, PA-C  loperamide (IMODIUM) 2 MG capsule Take 1 capsule (2 mg total) by mouth 4 (four) times daily as needed for diarrhea or loose stools. 01/14/16   Kristen N Ward, DO  ondansetron (ZOFRAN ODT) 4 MG disintegrating tablet Take 1 tablet (4 mg total) by mouth every 8 (eight) hours as needed for nausea or vomiting. 01/14/16   Kristen N Ward, DO  ondansetron (ZOFRAN) 4 MG tablet Take 1 tablet (4 mg total) by mouth every 6 (six) hours. 11/05/16   Eyvonne Mechanic, PA-C  predniSONE (STERAPRED UNI-PAK 21 TAB) 10 MG (21) TBPK tablet Take 1 tablet (10 mg total) by mouth daily. Take as directed 01/14/16   Layla Maw Ward, DO  Prenatal Vit-Fe Fumarate-FA (  PRENATAL MULTIVITAMIN) TABS Take 2 tablets by mouth daily.    Historical Provider, MD    Family History Family History  Problem Relation Age of Onset  . Asthma Mother   . Hyperlipidemia Mother   . Hypertension Mother   . Arthritis Mother   . Kidney Stones Father   . Stroke Father   . Cancer Maternal Aunt     breast  . Diabetes Paternal Aunt   . Cancer Paternal Uncle     leukemia  . Thyroid disease Maternal Grandmother   . Cancer Maternal Grandfather     lung  . Alcohol abuse Maternal Grandfather   . Hypertension Paternal Grandmother   . Diabetes Paternal Grandmother   . Alcohol abuse Paternal Grandfather     Social History Social History  Substance Use Topics  . Smoking status: Never Smoker  . Smokeless tobacco: Never Used  . Alcohol use No      Allergies   Azithromycin   Review of Systems Review of Systems  All other systems reviewed and are negative.    Physical Exam Updated Vital Signs BP (!) 101/53 (BP Location: Right Arm)   Pulse 87   Temp 99.5 F (37.5 C) (Oral)   Resp 18   LMP 10/17/2016   SpO2 99%   Physical Exam  Constitutional: She is oriented to person, place, and time. She appears well-developed and well-nourished.  HENT:  Head: Normocephalic and atraumatic.  Mouth/Throat: Uvula is midline, oropharynx is clear and moist and mucous membranes are normal. No oropharyngeal exudate, posterior oropharyngeal edema or posterior oropharyngeal erythema.  Eyes: Conjunctivae are normal. Pupils are equal, round, and reactive to light. Right eye exhibits no discharge. Left eye exhibits no discharge. No scleral icterus.  Neck: Normal range of motion. No JVD present. No tracheal deviation present.  No meningeal signs.  Cardiovascular: Regular rhythm and normal heart sounds.   Tachycardic  Pulmonary/Chest: Effort normal and breath sounds normal. No stridor. No respiratory distress. She has no wheezes. She has no rales. She exhibits no tenderness.  Abdominal: Soft. She exhibits no distension and no mass. There is no tenderness. There is no rebound and no guarding. No hernia.  Musculoskeletal: Normal range of motion. She exhibits no edema.  Neurological: She is alert and oriented to person, place, and time. Coordination normal.  Skin: Skin is warm.  Psychiatric: She has a normal mood and affect. Her behavior is normal. Judgment and thought content normal.  Nursing note and vitals reviewed.    ED Treatments / Results  Labs (all labs ordered are listed, but only abnormal results are displayed) Labs Reviewed  CBC WITH DIFFERENTIAL/PLATELET - Abnormal; Notable for the following:       Result Value   WBC 21.8 (*)    Neutro Abs 19.4 (*)    Monocytes Absolute 1.1 (*)    All other components within normal limits    COMPREHENSIVE METABOLIC PANEL - Abnormal; Notable for the following:    Potassium 3.4 (*)    CO2 21 (*)    Glucose, Bld 129 (*)    Creatinine, Ser 1.02 (*)    Calcium 8.8 (*)    Total Bilirubin 1.6 (*)    All other components within normal limits  URINALYSIS, ROUTINE W REFLEX MICROSCOPIC - Abnormal; Notable for the following:    APPearance HAZY (*)    Glucose, UA 100 (*)    Bilirubin Urine SMALL (*)    Ketones, ur 15 (*)    Leukocytes, UA TRACE (*)  All other components within normal limits  URINALYSIS, MICROSCOPIC (REFLEX) - Abnormal; Notable for the following:    Bacteria, UA RARE (*)    Squamous Epithelial / LPF 0-5 (*)    All other components within normal limits  INFLUENZA PANEL BY PCR (TYPE A & B)  I-STAT CG4 LACTIC ACID, ED  I-STAT BETA HCG BLOOD, ED ([facility], [facility], [facility] ONLY)  I-STAT CG4 LACTIC ACID, ED    EKG  EKG Interpretation None       Radiology Dg Chest 2 View  Result Date: 11/05/2016 CLINICAL DATA:  Productive cough . EXAM: CHEST  2 VIEW COMPARISON:  08/25/2016. FINDINGS: Mediastinum hilar structures normal. Mild bilateral interstitial prominence suggesting mild pneumonitis noted. No prominent pleural effusion. No pneumothorax. Heart size normal. No acute bony abnormality. IMPRESSION: Mild bilateral interstitial prominence suggesting mild pneumonitis. Electronically Signed   By: Maisie Fus  Register   On: 11/05/2016 12:02   Ct Abdomen Pelvis W Contrast  Result Date: 11/05/2016 CLINICAL DATA:  Right lower quadrant pain, fever, nausea/ vomiting, body aches EXAM: CT ABDOMEN AND PELVIS WITH CONTRAST TECHNIQUE: Multidetector CT imaging of the abdomen and pelvis was performed using the standard protocol following bolus administration of intravenous contrast. CONTRAST:  ISOVUE-300 IOPAMIDOL (ISOVUE-300) INJECTION 61% COMPARISON:  None. FINDINGS: Lower chest: Mild patchy opacities in the bilateral lower lobes, atelectasis versus mild pneumonia. Hepatobiliary: Liver is  notable for focal fat result perfusion along the falciform ligament. Gallbladder is unremarkable. No intrahepatic or extrahepatic ductal dilatation. Pancreas: Within normal limits. Spleen: Within normal limits. Adrenals/Urinary Tract: Adrenal glands are within normal limits. Kidneys are within normal limits.  No hydronephrosis. Bladder is mildly thick-walled although underdistended. Stomach/Bowel: Stomach is within normal limits. No evidence of bowel obstruction. Appendix is within normal limits (coronal image 39). Vascular/Lymphatic: No evidence of abdominal aortic aneurysm. No suspicious abdominopelvic lymphadenopathy. Reproductive: Uterus is within normal limits. Bilateral ovaries are within normal limits, noting a right corpus luteal cyst (series 3/image 74). Other: Small volume pelvic ascites, likely physiologic. Musculoskeletal: Visualized osseous structures are within normal limits. IMPRESSION: No evidence of bowel obstruction.  Normal appendix. Right corpus luteal cyst and small volume pelvic ascites, physiologic. Mild patchy opacities in the bilateral lower lobes, atelectasis versus pneumonia. Electronically Signed   By: Charline Bills M.D.   On: 11/05/2016 13:45    Procedures Procedures (including critical care time)  Medications Ordered in ED Medications  acetaminophen (TYLENOL) 325 MG tablet (not administered)  acetaminophen (TYLENOL) tablet 650 mg (650 mg Oral Given 11/05/16 1037)  sodium chloride 0.9 % bolus 1,000 mL (0 mLs Intravenous Stopped 11/05/16 1222)  sodium chloride 0.9 % bolus 1,000 mL (0 mLs Intravenous Stopped 11/05/16 1601)  iopamidol (ISOVUE-300) 61 % injection (100 mLs  Contrast Given 11/05/16 1320)  ibuprofen (ADVIL,MOTRIN) tablet 600 mg (600 mg Oral Given 11/05/16 1411)  levofloxacin (LEVAQUIN) tablet 750 mg (750 mg Oral Given 11/05/16 1556)     Initial Impression / Assessment and Plan / ED Course  I have reviewed the triage vital signs and the nursing  notes.  Pertinent labs & imaging results that were available during my care of the patient were reviewed by me and considered in my medical decision making (see chart for details).      Final Clinical Impressions(s) / ED Diagnoses   Final diagnoses:  RLQ abdominal pain  Community acquired pneumonia, unspecified laterality   Labs: I-STAT lactic acid, CBC, CMP, urinalysis   Imaging: DG chest  Consults:  Therapeutics: NS, tylenol, Levaquin  Discharge  Meds: Levaquin, Zofran  Assessment/Plan: 26 year old female presents today with likely pneumonia.  Patient originally presenting with fever vomiting tachycardia.  Patient had initial lactic that was normal, elevation in white blood cells, with no signs of endorgan damage.  Patient given several liters of fluid here which improved her tachycardia, Tylenol ibuprofen for the fever.  Patient reports very minor intermittent abdominal pain, due to original presentation CT scan ordered which showed no acute intra-abdominal pathology which showed likely pneumonia.  Patient does not have any other sources for acute infection, her influenza was negative here.  She is tolerating p.o. without any anti-emetics, looks visibly improved over her initial presentation.  I discussed findings with her and her mother, she will be treated for pneumonia as an outpatient.  Patient otherwise healthy in no acute respiratory distress.  She will be discharged home with strict return precautions, both the patient and her mother verbalized understanding and agreement to today's plan had no further questions or concerns at the time of discharge   New Prescriptions Discharge Medication List as of 11/05/2016  3:51 PM    START taking these medications   Details  levofloxacin (LEVAQUIN) 750 MG tablet Take 1 tablet (750 mg total) by mouth daily., Starting Wed 11/05/2016, Print    ondansetron (ZOFRAN) 4 MG tablet Take 1 tablet (4 mg total) by mouth every 6 (six) hours., Starting  Wed 11/05/2016, Print         Eyvonne Mechanic, PA-C 11/05/16 1631    Raeford Razor, MD 11/06/16 [phone]

## 2017-05-20 DIAGNOSIS — J069 Acute upper respiratory infection, unspecified: Secondary | ICD-10-CM | POA: Diagnosis not present

## 2017-05-20 DIAGNOSIS — J029 Acute pharyngitis, unspecified: Secondary | ICD-10-CM | POA: Diagnosis not present

## 2017-05-28 DIAGNOSIS — N911 Secondary amenorrhea: Secondary | ICD-10-CM | POA: Diagnosis not present

## 2017-06-08 DIAGNOSIS — Z3481 Encounter for supervision of other normal pregnancy, first trimester: Secondary | ICD-10-CM | POA: Diagnosis not present

## 2017-06-08 DIAGNOSIS — Z3685 Encounter for antenatal screening for Streptococcus B: Secondary | ICD-10-CM | POA: Diagnosis not present

## 2017-06-08 LAB — OB RESULTS CONSOLE HEPATITIS B SURFACE ANTIGEN: Hepatitis B Surface Ag: NEGATIVE

## 2017-06-08 LAB — OB RESULTS CONSOLE RUBELLA ANTIBODY, IGM: Rubella: IMMUNE

## 2017-06-08 LAB — OB RESULTS CONSOLE ABO/RH: RH Type: POSITIVE

## 2017-06-08 LAB — OB RESULTS CONSOLE HIV ANTIBODY (ROUTINE TESTING): HIV: NONREACTIVE

## 2017-06-08 LAB — OB RESULTS CONSOLE RPR: RPR: NONREACTIVE

## 2017-06-08 LAB — OB RESULTS CONSOLE GC/CHLAMYDIA
CHLAMYDIA, DNA PROBE: NEGATIVE
GC PROBE AMP, GENITAL: NEGATIVE

## 2017-06-08 LAB — OB RESULTS CONSOLE ANTIBODY SCREEN: Antibody Screen: NEGATIVE

## 2017-06-25 DIAGNOSIS — Z3491 Encounter for supervision of normal pregnancy, unspecified, first trimester: Secondary | ICD-10-CM | POA: Diagnosis not present

## 2017-06-25 DIAGNOSIS — Z113 Encounter for screening for infections with a predominantly sexual mode of transmission: Secondary | ICD-10-CM | POA: Diagnosis not present

## 2017-06-25 DIAGNOSIS — Z23 Encounter for immunization: Secondary | ICD-10-CM | POA: Diagnosis not present

## 2017-07-01 DIAGNOSIS — Z3491 Encounter for supervision of normal pregnancy, unspecified, first trimester: Secondary | ICD-10-CM | POA: Diagnosis not present

## 2017-07-01 DIAGNOSIS — Z3683 Encounter for fetal screening for congenital cardiac abnormalities: Secondary | ICD-10-CM | POA: Diagnosis not present

## 2017-07-01 DIAGNOSIS — Z36 Encounter for antenatal screening for chromosomal anomalies: Secondary | ICD-10-CM | POA: Diagnosis not present

## 2017-08-11 DIAGNOSIS — Z3A18 18 weeks gestation of pregnancy: Secondary | ICD-10-CM | POA: Diagnosis not present

## 2017-08-11 DIAGNOSIS — Z363 Encounter for antenatal screening for malformations: Secondary | ICD-10-CM | POA: Diagnosis not present

## 2017-10-05 DIAGNOSIS — J069 Acute upper respiratory infection, unspecified: Secondary | ICD-10-CM | POA: Diagnosis not present

## 2017-10-07 DIAGNOSIS — J069 Acute upper respiratory infection, unspecified: Secondary | ICD-10-CM | POA: Diagnosis not present

## 2017-10-09 DIAGNOSIS — Z3480 Encounter for supervision of other normal pregnancy, unspecified trimester: Secondary | ICD-10-CM | POA: Diagnosis not present

## 2017-10-15 DIAGNOSIS — O99810 Abnormal glucose complicating pregnancy: Secondary | ICD-10-CM | POA: Diagnosis not present

## 2017-10-15 DIAGNOSIS — Z3A28 28 weeks gestation of pregnancy: Secondary | ICD-10-CM | POA: Diagnosis not present

## 2017-12-11 DIAGNOSIS — Z3685 Encounter for antenatal screening for Streptococcus B: Secondary | ICD-10-CM | POA: Diagnosis not present

## 2017-12-11 DIAGNOSIS — Z3A36 36 weeks gestation of pregnancy: Secondary | ICD-10-CM | POA: Diagnosis not present

## 2017-12-11 DIAGNOSIS — Z3688 Encounter for antenatal screening for fetal macrosomia: Secondary | ICD-10-CM | POA: Diagnosis not present

## 2017-12-11 DIAGNOSIS — Z3480 Encounter for supervision of other normal pregnancy, unspecified trimester: Secondary | ICD-10-CM | POA: Diagnosis not present

## 2017-12-21 LAB — OB RESULTS CONSOLE GBS: GBS: NEGATIVE

## 2017-12-31 ENCOUNTER — Encounter (HOSPITAL_COMMUNITY): Payer: Self-pay | Admitting: *Deleted

## 2017-12-31 ENCOUNTER — Telehealth (HOSPITAL_COMMUNITY): Payer: Self-pay | Admitting: *Deleted

## 2017-12-31 NOTE — Telephone Encounter (Signed)
Preadmission screen  

## 2018-01-05 ENCOUNTER — Telehealth (HOSPITAL_COMMUNITY): Payer: Self-pay | Admitting: *Deleted

## 2018-01-05 ENCOUNTER — Encounter (HOSPITAL_COMMUNITY): Payer: Self-pay | Admitting: *Deleted

## 2018-01-05 NOTE — Telephone Encounter (Signed)
Preadmission screen  

## 2018-01-11 ENCOUNTER — Inpatient Hospital Stay (HOSPITAL_COMMUNITY): Payer: BLUE CROSS/BLUE SHIELD | Admitting: Anesthesiology

## 2018-01-11 ENCOUNTER — Inpatient Hospital Stay (HOSPITAL_COMMUNITY)
Admission: RE | Admit: 2018-01-11 | Discharge: 2018-01-13 | DRG: 768 | Disposition: A | Discharge status: Home / Self Care | Payer: BLUE CROSS/BLUE SHIELD | Attending: Obstetrics and Gynecology | Admitting: Obstetrics and Gynecology

## 2018-01-11 ENCOUNTER — Encounter (HOSPITAL_COMMUNITY): Payer: Self-pay

## 2018-01-11 DIAGNOSIS — O480 Post-term pregnancy: Principal | ICD-10-CM | POA: Diagnosis present

## 2018-01-11 DIAGNOSIS — O894 Spinal and epidural anesthesia-induced headache during the puerperium: Secondary | ICD-10-CM | POA: Diagnosis not present

## 2018-01-11 DIAGNOSIS — Z3A40 40 weeks gestation of pregnancy: Secondary | ICD-10-CM

## 2018-01-11 DIAGNOSIS — O34219 Maternal care for unspecified type scar from previous cesarean delivery: Secondary | ICD-10-CM | POA: Diagnosis not present

## 2018-01-11 DIAGNOSIS — E669 Obesity, unspecified: Secondary | ICD-10-CM | POA: Diagnosis not present

## 2018-01-11 DIAGNOSIS — Z23 Encounter for immunization: Secondary | ICD-10-CM | POA: Diagnosis not present

## 2018-01-11 DIAGNOSIS — O34211 Maternal care for low transverse scar from previous cesarean delivery: Secondary | ICD-10-CM | POA: Diagnosis not present

## 2018-01-11 DIAGNOSIS — O99214 Obesity complicating childbirth: Secondary | ICD-10-CM | POA: Diagnosis present

## 2018-01-11 DIAGNOSIS — G971 Other reaction to spinal and lumbar puncture: Secondary | ICD-10-CM | POA: Diagnosis not present

## 2018-01-11 LAB — CBC
HCT: 36 % (ref 36.0–46.0)
Hemoglobin: 12.3 g/dL (ref 12.0–15.0)
MCH: 29.5 pg (ref 26.0–34.0)
MCHC: 34.2 g/dL (ref 30.0–36.0)
MCV: 86.3 fL (ref 78.0–100.0)
PLATELETS: 132 10*3/uL — AB (ref 150–400)
RBC: 4.17 MIL/uL (ref 3.87–5.11)
RDW: 14.7 % (ref 11.5–15.5)
WBC: 10.1 10*3/uL (ref 4.0–10.5)

## 2018-01-11 LAB — RPR: RPR Ser Ql: NONREACTIVE

## 2018-01-11 LAB — ABO/RH: ABO/RH(D): O POS

## 2018-01-11 LAB — TYPE AND SCREEN
ABO/RH(D): O POS
Antibody Screen: NEGATIVE

## 2018-01-11 MED ORDER — TERBUTALINE SULFATE 1 MG/ML IJ SOLN
0.2500 mg | Freq: Once | INTRAMUSCULAR | Status: DC | PRN
Start: 2018-01-11 — End: 2018-01-11
  Filled 2018-01-11: qty 1

## 2018-01-11 MED ORDER — WITCH HAZEL-GLYCERIN EX PADS: 1.0000 "application " | MEDICATED_PAD | CUTANEOUS | Status: DC | PRN

## 2018-01-11 MED ORDER — OXYTOCIN 40 UNITS IN LACTATED RINGERS INFUSION - SIMPLE MED: 2.5000 [IU]/h | INTRAVENOUS | Status: DC

## 2018-01-11 MED ORDER — BENZOCAINE-MENTHOL 20-0.5 % EX AERO
1.0000 "application " | INHALATION_SPRAY | CUTANEOUS | Status: DC | PRN
  Administered 2018-01-11: 1 via TOPICAL
  Filled 2018-01-11: qty 56

## 2018-01-11 MED ORDER — PRENATAL MULTIVITAMIN CH
1.0000 | ORAL_TABLET | Freq: Every day | ORAL | Status: DC
  Administered 2018-01-12 – 2018-01-13 (×2): 1 via ORAL
  Filled 2018-01-11 (×3): qty 1

## 2018-01-11 MED ORDER — LACTATED RINGERS IV SOLN: 500.0000 mL | Freq: Once | INTRAVENOUS | Status: DC

## 2018-01-11 MED ORDER — TETANUS-DIPHTH-ACELL PERTUSSIS 5-2.5-18.5 LF-MCG/0.5 IM SUSP
0.5000 mL | Freq: Once | INTRAMUSCULAR | Status: AC
  Administered 2018-01-12: 0.5 mL via INTRAMUSCULAR
  Filled 2018-01-11: qty 0.5

## 2018-01-11 MED ORDER — DIBUCAINE 1 % RE OINT
1.0000 "application " | TOPICAL_OINTMENT | RECTAL | Status: DC | PRN
  Filled 2018-01-11: qty 28

## 2018-01-11 MED ORDER — ONDANSETRON HCL 4 MG/2ML IJ SOLN: 4.0000 mg | INTRAMUSCULAR | Status: DC | PRN

## 2018-01-11 MED ORDER — DIPHENHYDRAMINE HCL 25 MG PO CAPS: 25.0000 mg | ORAL_CAPSULE | Freq: Four times a day (QID) | ORAL | Status: DC | PRN

## 2018-01-11 MED ORDER — OXYTOCIN 40 UNITS IN LACTATED RINGERS INFUSION - SIMPLE MED
1.0000 m[IU]/min | INTRAVENOUS | Status: DC
  Administered 2018-01-11: 2 m[IU]/min via INTRAVENOUS
  Filled 2018-01-11: qty 1000

## 2018-01-11 MED ORDER — EPHEDRINE 5 MG/ML INJ
10.0000 mg | INTRAVENOUS | Status: DC | PRN
  Filled 2018-01-11: qty 2

## 2018-01-11 MED ORDER — OXYCODONE-ACETAMINOPHEN 5-325 MG PO TABS: 1.0000 | ORAL_TABLET | ORAL | Status: DC | PRN

## 2018-01-11 MED ORDER — PHENYLEPHRINE 40 MCG/ML (10ML) SYRINGE FOR IV PUSH (FOR BLOOD PRESSURE SUPPORT): 80.0000 ug | PREFILLED_SYRINGE | INTRAVENOUS | Status: DC | PRN

## 2018-01-11 MED ORDER — SOD CITRATE-CITRIC ACID 500-334 MG/5ML PO SOLN: 30.0000 mL | ORAL | Status: DC | PRN

## 2018-01-11 MED ORDER — ZOLPIDEM TARTRATE 5 MG PO TABS: 5.0000 mg | ORAL_TABLET | Freq: Every evening | ORAL | Status: DC | PRN

## 2018-01-11 MED ORDER — LIDOCAINE HCL (PF) 1 % IJ SOLN
INTRAMUSCULAR | Status: DC | PRN
  Administered 2018-01-11: 3 mL via EPIDURAL
  Administered 2018-01-11: 1 mL via EPIDURAL
  Administered 2018-01-11: 4 mL via EPIDURAL
  Administered 2018-01-11: 2 mL via EPIDURAL

## 2018-01-11 MED ORDER — PHENYLEPHRINE 40 MCG/ML (10ML) SYRINGE FOR IV PUSH (FOR BLOOD PRESSURE SUPPORT)
80.0000 ug | PREFILLED_SYRINGE | INTRAVENOUS | Status: DC | PRN
  Filled 2018-01-11: qty 10
  Filled 2018-01-11: qty 5

## 2018-01-11 MED ORDER — DIPHENHYDRAMINE HCL 50 MG/ML IJ SOLN: 12.5000 mg | INTRAMUSCULAR | Status: DC | PRN

## 2018-01-11 MED ORDER — OXYTOCIN BOLUS FROM INFUSION: 500.0000 mL | Freq: Once | INTRAVENOUS | Status: DC

## 2018-01-11 MED ORDER — MEDROXYPROGESTERONE ACETATE 150 MG/ML IM SUSP: 150.0000 mg | INTRAMUSCULAR | Status: DC | PRN

## 2018-01-11 MED ORDER — EPHEDRINE 5 MG/ML INJ: 10.0000 mg | INTRAVENOUS | Status: DC | PRN

## 2018-01-11 MED ORDER — LACTATED RINGERS IV SOLN
500.0000 mL | INTRAVENOUS | Status: DC | PRN
  Administered 2018-01-11: 500 mL via INTRAVENOUS

## 2018-01-11 MED ORDER — SENNOSIDES-DOCUSATE SODIUM 8.6-50 MG PO TABS
2.0000 | ORAL_TABLET | ORAL | Status: DC
  Administered 2018-01-12 (×2): 2 via ORAL
  Filled 2018-01-11 (×2): qty 2

## 2018-01-11 MED ORDER — LIDOCAINE HCL (PF) 1 % IJ SOLN
30.0000 mL | INTRAMUSCULAR | Status: DC | PRN
  Filled 2018-01-11: qty 30

## 2018-01-11 MED ORDER — FENTANYL 2.5 MCG/ML BUPIVACAINE 1/10 % EPIDURAL INFUSION (WH - ANES)
14.0000 mL/h | INTRAMUSCULAR | Status: DC | PRN
  Administered 2018-01-11: 10 mL/h via EPIDURAL
  Administered 2018-01-11: 14 mL/h via EPIDURAL
  Filled 2018-01-11: qty 100

## 2018-01-11 MED ORDER — PHENYLEPHRINE 40 MCG/ML (10ML) SYRINGE FOR IV PUSH (FOR BLOOD PRESSURE SUPPORT)
80.0000 ug | PREFILLED_SYRINGE | INTRAVENOUS | Status: DC | PRN
  Filled 2018-01-11: qty 5

## 2018-01-11 MED ORDER — LACTATED RINGERS IV SOLN
INTRAVENOUS | Status: DC
  Administered 2018-01-11: 08:00:00 via INTRAVENOUS

## 2018-01-11 MED ORDER — ACETAMINOPHEN 325 MG PO TABS
650.0000 mg | ORAL_TABLET | ORAL | Status: DC | PRN
  Administered 2018-01-11: 650 mg via ORAL
  Filled 2018-01-11: qty 2

## 2018-01-11 MED ORDER — MEASLES, MUMPS & RUBELLA VAC ~~LOC~~ INJ
0.5000 mL | INJECTION | Freq: Once | SUBCUTANEOUS | Status: DC
  Filled 2018-01-11: qty 0.5

## 2018-01-11 MED ORDER — SIMETHICONE 80 MG PO CHEW: 80.0000 mg | CHEWABLE_TABLET | ORAL | Status: DC | PRN

## 2018-01-11 MED ORDER — EPHEDRINE 5 MG/ML INJ
10.0000 mg | INTRAVENOUS | Status: DC | PRN
Start: 2018-01-11 — End: 2018-01-11
  Filled 2018-01-11: qty 2

## 2018-01-11 MED ORDER — OXYCODONE-ACETAMINOPHEN 5-325 MG PO TABS: 2.0000 | ORAL_TABLET | ORAL | Status: DC | PRN

## 2018-01-11 MED ORDER — IBUPROFEN 600 MG PO TABS
600.0000 mg | ORAL_TABLET | Freq: Four times a day (QID) | ORAL | Status: DC
  Administered 2018-01-11 – 2018-01-13 (×8): 600 mg via ORAL
  Filled 2018-01-11 (×8): qty 1

## 2018-01-11 MED ORDER — COCONUT OIL OIL
1.0000 "application " | TOPICAL_OIL | Status: DC | PRN
  Filled 2018-01-11: qty 120

## 2018-01-11 MED ORDER — BUTORPHANOL TARTRATE 1 MG/ML IJ SOLN
1.0000 mg | INTRAMUSCULAR | Status: DC | PRN
Start: 2018-01-11 — End: 2018-01-11
  Administered 2018-01-11: 1 mg via INTRAVENOUS
  Filled 2018-01-11: qty 1

## 2018-01-11 MED ORDER — OXYCODONE-ACETAMINOPHEN 5-325 MG PO TABS
1.0000 | ORAL_TABLET | ORAL | Status: DC | PRN
  Administered 2018-01-11 – 2018-01-12 (×2): 1 via ORAL
  Administered 2018-01-12 – 2018-01-13 (×3): 2 via ORAL
  Filled 2018-01-11 (×2): qty 2
  Filled 2018-01-11 (×2): qty 1
  Filled 2018-01-11: qty 2

## 2018-01-11 MED ORDER — ACETAMINOPHEN 325 MG PO TABS: 650.0000 mg | ORAL_TABLET | ORAL | Status: DC | PRN

## 2018-01-11 MED ORDER — ONDANSETRON HCL 4 MG/2ML IJ SOLN
4.0000 mg | Freq: Four times a day (QID) | INTRAMUSCULAR | Status: DC | PRN
  Administered 2018-01-11: 4 mg via INTRAVENOUS
  Filled 2018-01-11: qty 2

## 2018-01-11 MED ORDER — ONDANSETRON HCL 4 MG PO TABS: 4.0000 mg | ORAL_TABLET | ORAL | Status: DC | PRN

## 2018-01-11 NOTE — Progress Notes (Signed)
This note also relates to the following rows which could not be included: BP - Cannot attach notes to unvalidated device data Pulse Rate - Cannot attach notes to unvalidated device data  Dr Jean RosenthalJackson still at bedside.

## 2018-01-11 NOTE — Progress Notes (Signed)
SVD of vigorous female infant w/ apgars of 9,9.  Placenta delivered spontaneous w/ 3VC.   3rd degree lac repaired w/ 3-0 vicryl rapide.  Fundus firm.  EBL 300cc .

## 2018-01-11 NOTE — Progress Notes (Signed)
Pt comfortable  FHT 140s w/ accels Toco quiet Cvx 3/70/-2 AROM - clear  A/P:  Continue pitocin IOL

## 2018-01-11 NOTE — Anesthesia Postprocedure Evaluation (Signed)
Anesthesia Post Note  Patient: [name]  Procedure(s) Performed: AN AD HOC LABOR EPIDURAL     Patient location during evaluation: L&D Anesthesia Type: Epidural Level of consciousness: awake and alert Pain management: pain level controlled Vital Signs Assessment: post-procedure vital signs reviewed and stable Respiratory status: spontaneous breathing Cardiovascular status: blood pressure returned to baseline Postop Assessment: no headache, no backache, epidural receding, adequate PO intake, no apparent nausea or vomiting and patient able to bend at knees Anesthetic complications: no Comments: Patient sitting upright in bed, no headache.    Last Vitals:  Vitals:   01/11/18 1502 01/11/18 1512  BP: 122/89 139/89  Pulse: (!) 104 (!) 108  Resp:    Temp:    SpO2:      Last Pain:  Vitals:   01/11/18 1512  TempSrc:   PainSc: 0-No pain   Pain Goal:                 Perris Conwell

## 2018-01-11 NOTE — Progress Notes (Signed)
Pt comfortable w/ epidural  FHT reassuring, occasional early Toco Q2 Cvx 6/90/-1 IUPC placed  A/P:  TOLAC Exp mngt Pitocin IOL

## 2018-01-11 NOTE — Anesthesia Procedure Notes (Signed)
Epidural Patient location during procedure: OB Start time: 01/11/2018 12:30 PM End time: 01/11/2018 1:12 PM  Staffing Anesthesiologist: Jairo BenJackson, Elley Harp, MD Performed: anesthesiologist   Preanesthetic Checklist Completed: patient identified, surgical consent, pre-op evaluation, timeout performed, IV checked, risks and benefits discussed and monitors and equipment checked  Epidural Patient position: sitting Prep: site prepped and draped and DuraPrep Patient monitoring: blood pressure, continuous pulse ox and heart rate Approach: midline Location: L2-L3 (first attempt L 3,4) Injection technique: LOR air  Needle:  Needle type: Tuohy  Needle gauge: 17 G Needle length: 9 cm Needle insertion depth: 5 cm Catheter type: closed end flexible Catheter size: 19 Gauge Catheter at skin depth: 10 cm Test dose: negative (1% lidocaine)  Assessment Sensory level: T8 Events: blood not aspirated, injection not painful, no injection resistance, negative IV test and no paresthesia  Additional Notes Pt identified in Labor room.  Monitors applied. Working IV access confirmed. Sterile prep, drape lumbar spine.  1% lido local L 2,3.  #17ga Touhy LOR air at 5 cm L 2,3, slow drip clear fluid, probable CSF, Touhy removed, repeat local L 2,3 and #17 ga Touhy LOR 5 cm, cath in easily to 10 cm. Although no CSF with Touhy, clear fluid slow drip from end of cath, difficult to aspirate. IV Test dose OK, SAB test OK, so cath dosed and infusion begun.  Patient asymptomatic, VSS, no heme aspirated, tolerated well. Discussed with pt the possibility of PDPH/A, no headache at present. Will follow.  Sandford Craze Leonides Minder, MDReason for block:procedure for pain

## 2018-01-11 NOTE — Anesthesia Pain Management Evaluation Note (Signed)
  [facility] Pain Management Visit Note  Patient: [name], 27 y.o., female  "Hello I am a [facility]. We have an anesthesia team available at all times to provide care throughout the hospital, including epidural management and anesthesia for C-section. I don't know your plan for the delivery whether it a natural birth, water birth, IV sedation, nitrous supplementation, doula or epidural, but we want to meet your pain goals."   1.Was your pain managed to your expectations on prior hospitalizations?   Yes   2.What is your expectation for pain management during this hospitalization?     Labor support without medications  3.How can we help you reach that goal? unsure  Record the patient's initial score and the patient's pain goal.   Pain: 0  Pain Goal: 10 [facility] wants you to be able to say your pain was always managed very well.  Charge RN requested that we not stop in to talk with the pt because in her birth plan she does not want to be asked about her pain level.   [name],[name] 01/11/2018

## 2018-01-11 NOTE — Progress Notes (Signed)
600 ibuprofen po

## 2018-01-11 NOTE — Anesthesia Preprocedure Evaluation (Addendum)
Anesthesia Evaluation  Patient identified by MRN, date of birth, ID band Patient awake    Reviewed: Allergy & Precautions, NPO status , Patient's Chart, lab work & pertinent test results  History of Anesthesia Complications Negative for: history of anesthetic complications  Airway Mallampati: II  TM Distance: >3 FB Neck ROM: Full    Dental  (+) Dental Advisory Given   Pulmonary    breath sounds clear to auscultation       Cardiovascular negative cardio ROS   Rhythm:Regular Rate:Normal     Neuro/Psych  Headaches, Anxiety Depression    GI/Hepatic Neg liver ROS, GERD  ,  Endo/Other  obesity  Renal/GU negative Renal ROS     Musculoskeletal   Abdominal (+) + obese,   Peds  Hematology plt 132k   Anesthesia Other Findings   Reproductive/Obstetrics (+) Pregnancy                            Anesthesia Physical Anesthesia Plan  ASA: II  Anesthesia Plan: Epidural   Post-op Pain Management:    Induction:   PONV Risk Score and Plan: 2 and Treatment may vary due to age or medical condition  Airway Management Planned: Natural Airway  Additional Equipment:   Intra-op Plan:   Post-operative Plan:   Informed Consent: I have reviewed the patients History and Physical, chart, labs and discussed the procedure including the risks, benefits and alternatives for the proposed anesthesia with the patient or authorized representative who has indicated his/her understanding and acceptance.   Dental advisory given  Plan Discussed with:   Anesthesia Plan Comments: (Patient identified. Risks/Benefits/Options discussed with patient including but not limited to bleeding, infection, nerve damage, paralysis, failed block, incomplete pain control, headache, blood pressure changes, nausea, vomiting, reactions to medication both or allergic, itching and postpartum back pain. Confirmed with bedside nurse the  patient's most recent platelet count. Confirmed with patient that they are not currently taking any anticoagulation, have any bleeding history or any family history of bleeding disorders. Patient expressed understanding and wished to proceed. All questions were answered. )       Anesthesia Quick Evaluation

## 2018-01-11 NOTE — Progress Notes (Signed)
This note also relates to the following rows which could not be included: BP - Cannot attach notes to unvalidated device data Pulse Rate - Cannot attach notes to unvalidated device data  Dr Sandford Craze Jackson still in room, adjusting spinal.

## 2018-01-11 NOTE — H&P (Signed)
[name] is a 27 y.o. female presenting for postdates IOL.  Pt with previous c-section, desires TOLAC.  Pregnancy uncomplicated.  OB History    Gravida  2   Para  1   Term  1   Preterm  0   AB  0   Living  1     SAB  0   TAB  0   Ectopic  0   Multiple  0   Live Births  1          Past Medical History:  Diagnosis Date  . Abnormal Pap smear    had colpo with abnormal paps afterward  . Anxiety   . Asthma    childhood  . Depression   . H/O varicella   . ZOXWRUEA(540.9Headache(784.0)    miagraine   Past Surgical History:  Procedure Laterality Date  . CESAREAN SECTION  07/26/2012   Procedure: CESAREAN SECTION;  Surgeon: Juluis MireJohn S McComb, MD;  Location: [facility];  Service: Obstetrics;  Laterality: N/A;  . COLPOSCOPY    . TYMPANOSTOMY TUBE PLACEMENT    . wisdom teeth     Family History: family history includes Alcohol abuse in her maternal grandfather and paternal grandfather; Arthritis in her mother; Asthma in her mother; Cancer in her maternal aunt, maternal grandfather, and paternal uncle; Diabetes in her paternal aunt and paternal grandmother; Hyperlipidemia in her mother; Hypertension in her mother and paternal grandmother; Kidney Stones in her father; Stroke in her father; Thyroid disease in her maternal grandmother. Social History:  reports that she has never smoked. She has never used smokeless tobacco. She reports that she does not drink alcohol or use drugs.     Maternal Diabetes: No Genetic Screening: Normal Maternal Ultrasounds/Referrals: Normal Fetal Ultrasounds or other Referrals:  None Maternal Substance Abuse:  No Significant Maternal Medications:  None Significant Maternal Lab Results:  None Other Comments:  None  ROS History   Height 5\' 5"  (1.651 m), weight 225 lb (102.1 kg). Exam Physical Exam  Gen - NAD Abd - gravid, NT  EFW 8# Ext - NT, no edema  Prenatal labs: ABO, Rh: O/Positive/-- (11/26 0000) Antibody: Negative (11/26 0000) Rubella:  Immune (11/26 0000) RPR: Nonreactive (11/26 0000)  HBsAg: Negative (11/26 0000)  HIV: Non-reactive (11/26 0000)  GBS:   negative  Assessment/Plan: Admit Pitocin IOL AROM when able   Zelphia CairoGretchen Caydin Yeatts 01/11/2018, 7:47 AM

## 2018-01-12 LAB — CBC
HCT: 25 % — ABNORMAL LOW (ref 36.0–46.0)
HEMOGLOBIN: 8.7 g/dL — AB (ref 12.0–15.0)
MCH: 30.3 pg (ref 26.0–34.0)
MCHC: 34.8 g/dL (ref 30.0–36.0)
MCV: 87.1 fL (ref 78.0–100.0)
Platelets: 132 10*3/uL — ABNORMAL LOW (ref 150–400)
RBC: 2.87 MIL/uL — AB (ref 3.87–5.11)
RDW: 15 % (ref 11.5–15.5)
WBC: 13.4 10*3/uL — AB (ref 4.0–10.5)

## 2018-01-12 NOTE — Progress Notes (Signed)
Patient lying in bed resting. Rates headache a 1.  One tablet of Percocet had been given at 0935.

## 2018-01-12 NOTE — Lactation Note (Signed)
This note was copied from a baby's chart. Lactation Consultation Note  Patient Name: Cheryl Johnella MoloneyChelsea Sheu ZOXWR'UToday's Date: 01/12/2018 Reason for consult: Initial assessment;Term  p2 mother whose infant is now 5413 hours old.  Mother breastfeed her first child for 3 weeks but hopes to feed this baby much longer.  Baby sleeping in bassinet when I arrived.  Mother stated that baby is feeding well and latching much better than her first child.  There is no pain with the latch and baby is calm after feedings.  Encouraged feeding 8-12 times/24 hours or more if baby shows cues.  Continue STS as much as possible and breast massage with hand expression after feedings.  Offered to observe/assist with latch if help is needed.  Mother will call for assistance prn.  Mom made aware of O/P services, breastfeeding support groups, community resources, and our phone # for post-discharge questions.    Maternal Data Formula Feeding for Exclusion: No Has patient been taught Hand Expression?: Yes Does the patient have breastfeeding experience prior to this delivery?: Yes             Lactation Tools Discussed/Used WIC Program: No   Consult Status Consult Status: Follow-up Date: 01/13/18 Follow-up type: In-patient    Edvin Albus R Kamden Stanislaw 01/12/2018, 5:41 AM

## 2018-01-12 NOTE — Progress Notes (Signed)

## 2018-01-12 NOTE — Progress Notes (Signed)
Patient complains of a headache of a 4 to 5 on the 0-10 scale  that started last night. Worse when standing. Told patient to drink plenty of fluids. Dr. Mal AmabileBrock from Anesthesia notified. Dr. Langston MaskerMorris aware. Encouraged patient to drink some caffeinated beverages and notify RN if headache worsens.

## 2018-01-12 NOTE — Plan of Care (Signed)
Progressing appropriately. Encouraged to call for assistance as needed, and for LATCH assessment.  

## 2018-01-12 NOTE — Progress Notes (Signed)
Post Partum Day 1 Subjective: up ad lib, voiding and tolerating PO.  Complains of HA 4-5/10 which is positional; improved with caffeine.  Objective: Blood pressure 120/68, pulse 87, temperature 98.1 F (36.7 C), temperature source Oral, resp. rate 18, height 5\' 5"  (1.651 m), weight 225 lb (102.1 kg), SpO2 100 %, unknown if currently breastfeeding.  Physical Exam:  General: alert, cooperative and appears stated age Lochia: appropriate Uterine Fundus: firm Incision: healing well, no significant drainage, no dehiscence DVT Evaluation: No evidence of DVT seen on physical exam. Negative Homan's sign. No cords or calf tenderness.  Recent Labs    01/11/18 0749 01/12/18 0521  HGB 12.3 8.7*  HCT 36.0 25.0*    Assessment/Plan: Plan for discharge tomorrow, Breastfeeding and Circumcision prior to discharge  Patient is counseled for circ including risk of bleeding, infection, and scarring.  All questions were answered and patient wishes to proceed. Monitor HA and notify Anesthesia if worsens.   LOS: 1 day   Welda Azzarello 01/12/2018, 8:51 AM

## 2018-01-13 ENCOUNTER — Inpatient Hospital Stay (HOSPITAL_COMMUNITY): Payer: BLUE CROSS/BLUE SHIELD | Admitting: Anesthesiology

## 2018-01-13 MED ORDER — LACTATED RINGERS IV BOLUS: 500.0000 mL | Freq: Once | INTRAVENOUS | Status: DC

## 2018-01-13 MED ORDER — OXYCODONE-ACETAMINOPHEN 5-325 MG PO TABS: 1.0000 | ORAL_TABLET | ORAL | 0 refills | Status: DC | PRN

## 2018-01-13 MED ORDER — FENTANYL CITRATE (PF) 100 MCG/2ML IJ SOLN
100.0000 ug | Freq: Once | INTRAMUSCULAR | Status: AC
  Administered 2018-01-13: 100 ug via INTRAVENOUS

## 2018-01-13 MED ORDER — FENTANYL CITRATE (PF) 100 MCG/2ML IJ SOLN
INTRAMUSCULAR | Status: AC
  Administered 2018-01-13: 100 ug via INTRAVENOUS
  Filled 2018-01-13: qty 2

## 2018-01-13 MED ORDER — IBUPROFEN 600 MG PO TABS: 600.0000 mg | ORAL_TABLET | Freq: Four times a day (QID) | ORAL | 0 refills | Status: DC

## 2018-01-13 NOTE — Lactation Note (Signed)
This note was copied from a baby's chart. Lactation Consultation Note: Mother returned from getting a blood patch. Mother continues to complain of slight HA. Assist mother with side lying.  Mother hand expresses colostrum easily.   Assist mother in side lying position. Father at bedside for instructions on assisting mother with positioning .  Infant latched on, but checks dimpling. Mother reports slight pinch. Infant removed from breast several times. Observed mothers nipple and nipple round without compression.   Mother reports that infants cheeks have not been dimpling.  Assist with flanging infants lips for wider gape. Infant sustained latch with suckling and observed swallows for 20-25 mins.   Staff nurse reports that she will assist mother in latching infant to alternate breast.  Hand expressed a few drops from breast into spoon. Unable to obtain enough to spoon feed.  Parents were given a curved tip syringe with instructions to use if need to supplement with ebm/formula . Advised to page for staff assistance if needed additional support with breastfeeding or supplementing.   Advised to limit pacifier use . Encouraged father to do skin to skin if mother feeling bad with HA.  Lots of teaching with mother on cue base feeding and advised to feed infant at least 8-12 times in 24 hours. Discussed cluster feeding. Mother reports that she breastfed other child for 2 weeks but had difficulties with poor latch.  Discussed treatment and prevention of engorgement . Mother advised to post pump when she gets home to protect her milk supply.  Mother is aware of available LC services , [facility]   Patient Name: [name]'XToday's Date: 01/13/2018 Reason for consult: Follow-up assessment   Maternal Data    Feeding Feeding Type: Breast Fed Length of feed: 20 min(infant still feeding when I left the room)  LATCH Score Latch: Grasps breast easily,  tongue down, lips flanged, rhythmical sucking.  Audible Swallowing: Spontaneous and intermittent  Type of Nipple: Everted at rest and after stimulation  Comfort (Breast/Nipple): Soft / non-tender  Hold (Positioning): Full assist, staff holds infant at breast(mother side lying, has return from PACU after Blood Patch)  LATCH Score: 8  Interventions Interventions: Assisted with latch;Skin to skin;Hand express;Breast compression;Adjust position;Support pillows;Position options;Expressed milk  Lactation Tools Discussed/Used     Consult Status Consult Status: PRN(mother to be D/C home at 5 p,m) Date: 01/13/18 Follow-up type: In-patient    Stevan BornKendrick, Courtney Bellizzi [facility] 01/13/2018, 2:00 PM

## 2018-01-13 NOTE — Progress Notes (Signed)
Post Partum Day 2 Subjective: up ad lib, voiding, tolerating PO and postional HA c/w spinal ha  Objective: Blood pressure 117/78, pulse 86, temperature 97.8 F (36.6 C), temperature source Oral, resp. rate 18, height 5\' 5"  (1.651 m), weight 225 lb (102.1 kg), SpO2 99 %, unknown if currently breastfeeding.  Physical Exam:  General: alert, cooperative, appears stated age and mild distress Lochia: appropriate Uterine Fundus: firm Incision: healing well DVT Evaluation: No evidence of DVT seen on physical exam.  Recent Labs    01/11/18 0749 01/12/18 0521  HGB 12.3 8.7*  HCT 36.0 25.0*    Assessment/Plan: Discharge home and Breastfeeding  After anesthesia evaluates HA   LOS: 2 days   Annaston Upham C 01/13/2018, 9:14 AM

## 2018-01-13 NOTE — Anesthesia Procedure Notes (Signed)
Epidural Patient location during procedure: OB Start time: 01/13/2018 10:51 AM End time: 01/13/2018 11:08 AM  Staffing Anesthesiologist: Heather RobertsSinger, Sina Sumpter, MD Performed: anesthesiologist   Preanesthetic Checklist Completed: patient identified, site marked, pre-op evaluation, timeout performed, IV checked, risks and benefits discussed and monitors and equipment checked  Epidural Patient position: sitting Prep: DuraPrep Patient monitoring: heart rate, cardiac monitor, continuous pulse ox and blood pressure Approach: midline Location: L2-L3 Injection technique: LOR saline  Needle:  Needle type: Tuohy  Needle gauge: 17 G Needle length: 9 cm Catheter size: 20 Guage Test dose: Other  Assessment Events: blood not aspirated, injection not painful, no injection resistance and negative IV test  Additional Notes Informed consent obtained prior to proceeding including risk of failure, 1% risk of PDPH, risk of minor discomfort and bruising.  Discussed rare but serious complications including epidural abscess, permanent nerve injury, epidural hematoma.  Discussed alternatives to epidural analgesia and patient desires to proceed.  Timeout performed pre-procedure verifying patient name, procedure, and platelet count.  @0  cc of autologous blood drawn in sterile fashion injected into the epidural space.  Pt reported improvement.  Patient tolerated procedure well.

## 2018-01-13 NOTE — Progress Notes (Signed)
[facility] Krist[facility] BlueSinger[facility], [facility] obtained, Time out preformed, pt prepped and procedure begun, IV blood draw per CNA.  Procedure finished with no complications, pt flat on back for recovery.

## 2018-01-13 NOTE — Discharge Summary (Signed)
Obstetric Discharge Summary Reason for Admission: induction of labor Prenatal Procedures: none Intrapartum Procedures: spontaneous vaginal delivery VBAC Postpartum Procedures: none Complications-Operative and Postpartum: 3 degree perineal laceration Hemoglobin  Date Value Ref Range Status  01/12/2018 8.7 (L) 12.0 - 15.0 g/dL Final    Comment:    DELTA CHECK NOTED REPEATED TO VERIFY    HCT  Date Value Ref Range Status  01/12/2018 25.0 (L) 36.0 - 46.0 % Final    Physical Exam:  General: alert, cooperative, appears stated age and mild distress Lochia: appropriate Uterine Fundus: firm Incision: healing well DVT Evaluation: No evidence of DVT seen on physical exam.  Discharge Diagnoses: Term Pregnancy-delivered  Discharge Information: Date: 01/13/2018 Activity: pelvic rest Diet: routine Medications: Ibuprofen and Percocet Condition: stable Instructions: refer to practice specific booklet Discharge to: home   Newborn Data: Live born female  Birth Weight: 9 lb 5 oz (4224 g) APGAR: 9, 9  Newborn Delivery   Birth date/time:  01/11/2018 15:55:00 Delivery type:  VBAC, Spontaneous     Home with mother.  Margia Wiesen C 01/13/2018, 9:16 AM

## 2018-01-13 NOTE — Anesthesia Preprocedure Evaluation (Signed)
Anesthesia Evaluation  Patient identified by MRN, date of birth, ID band Patient awake    Reviewed: Allergy & Precautions, NPO status , Patient's Chart, lab work & pertinent test results  History of Anesthesia Complications Negative for: history of anesthetic complications  Airway Mallampati: II  TM Distance: >3 FB Neck ROM: Full    Dental  (+) Dental Advisory Given   Pulmonary neg pulmonary ROS,    breath sounds clear to auscultation       Cardiovascular negative cardio ROS   Rhythm:Regular Rate:Normal     Neuro/Psych  Headaches, Anxiety Depression negative neurological ROS  negative psych ROS   GI/Hepatic negative GI ROS, Neg liver ROS, GERD  ,  Endo/Other  negative endocrine ROSobesity  Renal/GU negative Renal ROS  negative genitourinary   Musculoskeletal negative musculoskeletal ROS (+)   Abdominal (+) + obese,   Peds negative pediatric ROS (+)  Hematology negative hematology ROS (+) plt 132k   Anesthesia Other Findings   Reproductive/Obstetrics negative OB ROS                             Anesthesia Physical  Anesthesia Plan  ASA: II  Anesthesia Plan: Epidural   Post-op Pain Management:    Induction:   PONV Risk Score and Plan:   Airway Management Planned: Natural Airway  Additional Equipment:   Intra-op Plan:   Post-operative Plan:   Informed Consent: I have reviewed the patients History and Physical, chart, labs and discussed the procedure including the risks, benefits and alternatives for the proposed anesthesia with the patient or authorized representative who has indicated his/her understanding and acceptance.   Dental advisory given  Plan Discussed with: Anesthesiologist and CRNA  Anesthesia Plan Comments: (Pt reports a postural H/A x 2 days.  No fever reported.  Dr. Rana SnareLowe called to report probable PDPHA.  Pt reports difficulty with epidural 2 days ago felt  to be possible wet tap. R/B/I of EBP discussed and accepted.)        Anesthesia Quick Evaluation

## 2018-02-24 DIAGNOSIS — Z124 Encounter for screening for malignant neoplasm of cervix: Secondary | ICD-10-CM | POA: Diagnosis not present

## 2018-02-24 DIAGNOSIS — Z1389 Encounter for screening for other disorder: Secondary | ICD-10-CM | POA: Diagnosis not present

## 2018-05-12 DIAGNOSIS — N61 Mastitis without abscess: Secondary | ICD-10-CM | POA: Diagnosis not present

## 2018-06-07 DIAGNOSIS — N649 Disorder of breast, unspecified: Secondary | ICD-10-CM | POA: Diagnosis not present

## 2018-09-09 DIAGNOSIS — R87610 Atypical squamous cells of undetermined significance on cytologic smear of cervix (ASC-US): Secondary | ICD-10-CM | POA: Diagnosis not present

## 2018-09-22 DIAGNOSIS — J111 Influenza due to unidentified influenza virus with other respiratory manifestations: Secondary | ICD-10-CM | POA: Diagnosis not present

## 2018-09-22 DIAGNOSIS — R509 Fever, unspecified: Secondary | ICD-10-CM | POA: Diagnosis not present

## 2018-09-22 DIAGNOSIS — M791 Myalgia, unspecified site: Secondary | ICD-10-CM | POA: Diagnosis not present

## 2018-09-22 DIAGNOSIS — R5383 Other fatigue: Secondary | ICD-10-CM | POA: Diagnosis not present

## 2018-09-22 DIAGNOSIS — R51 Headache: Secondary | ICD-10-CM | POA: Diagnosis not present

## 2018-09-24 DIAGNOSIS — J209 Acute bronchitis, unspecified: Secondary | ICD-10-CM | POA: Diagnosis not present

## 2018-10-21 IMAGING — CR DG CHEST 2V
2 series · 2 of 2 positions shown · non-contrast
Comparison: 08/25/2016.

CLINICAL DATA: Productive cough .

EXAM:
CHEST  2 VIEW

[chest pa]
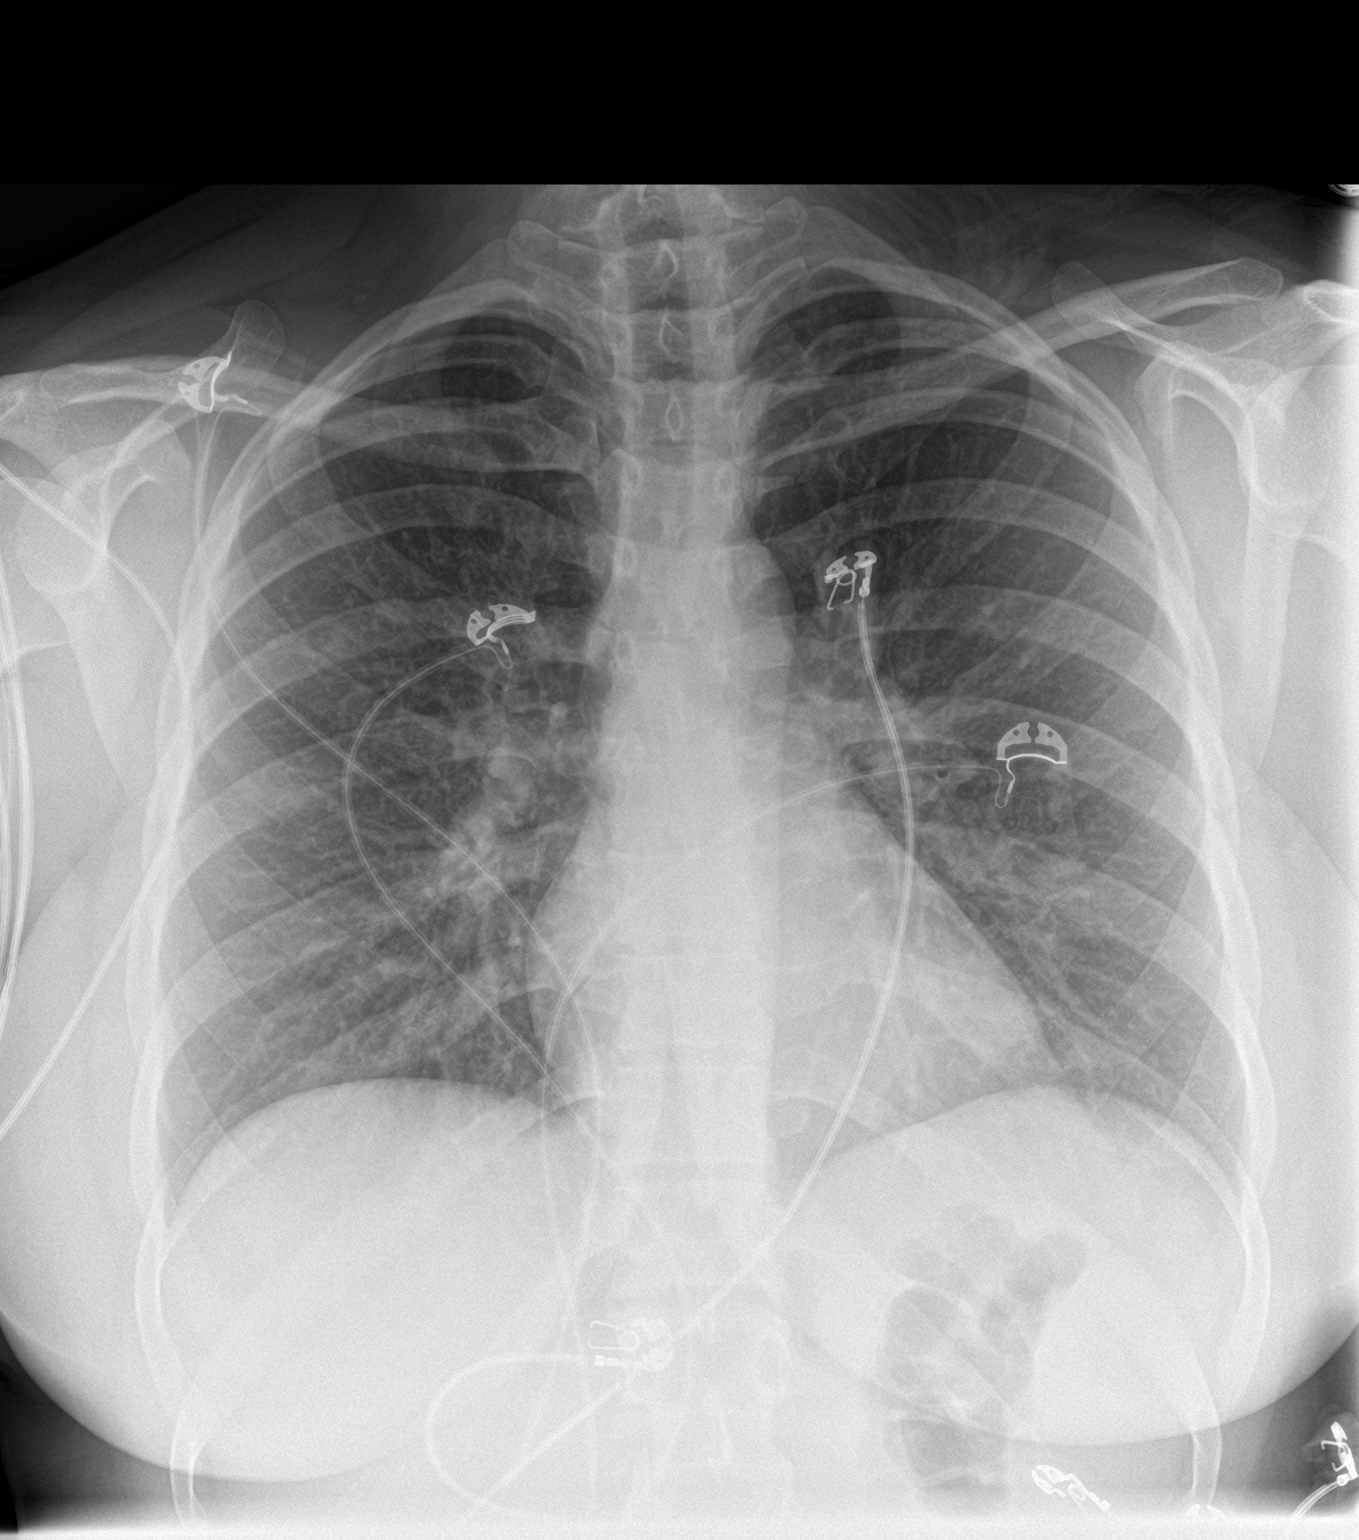

[chest lat]
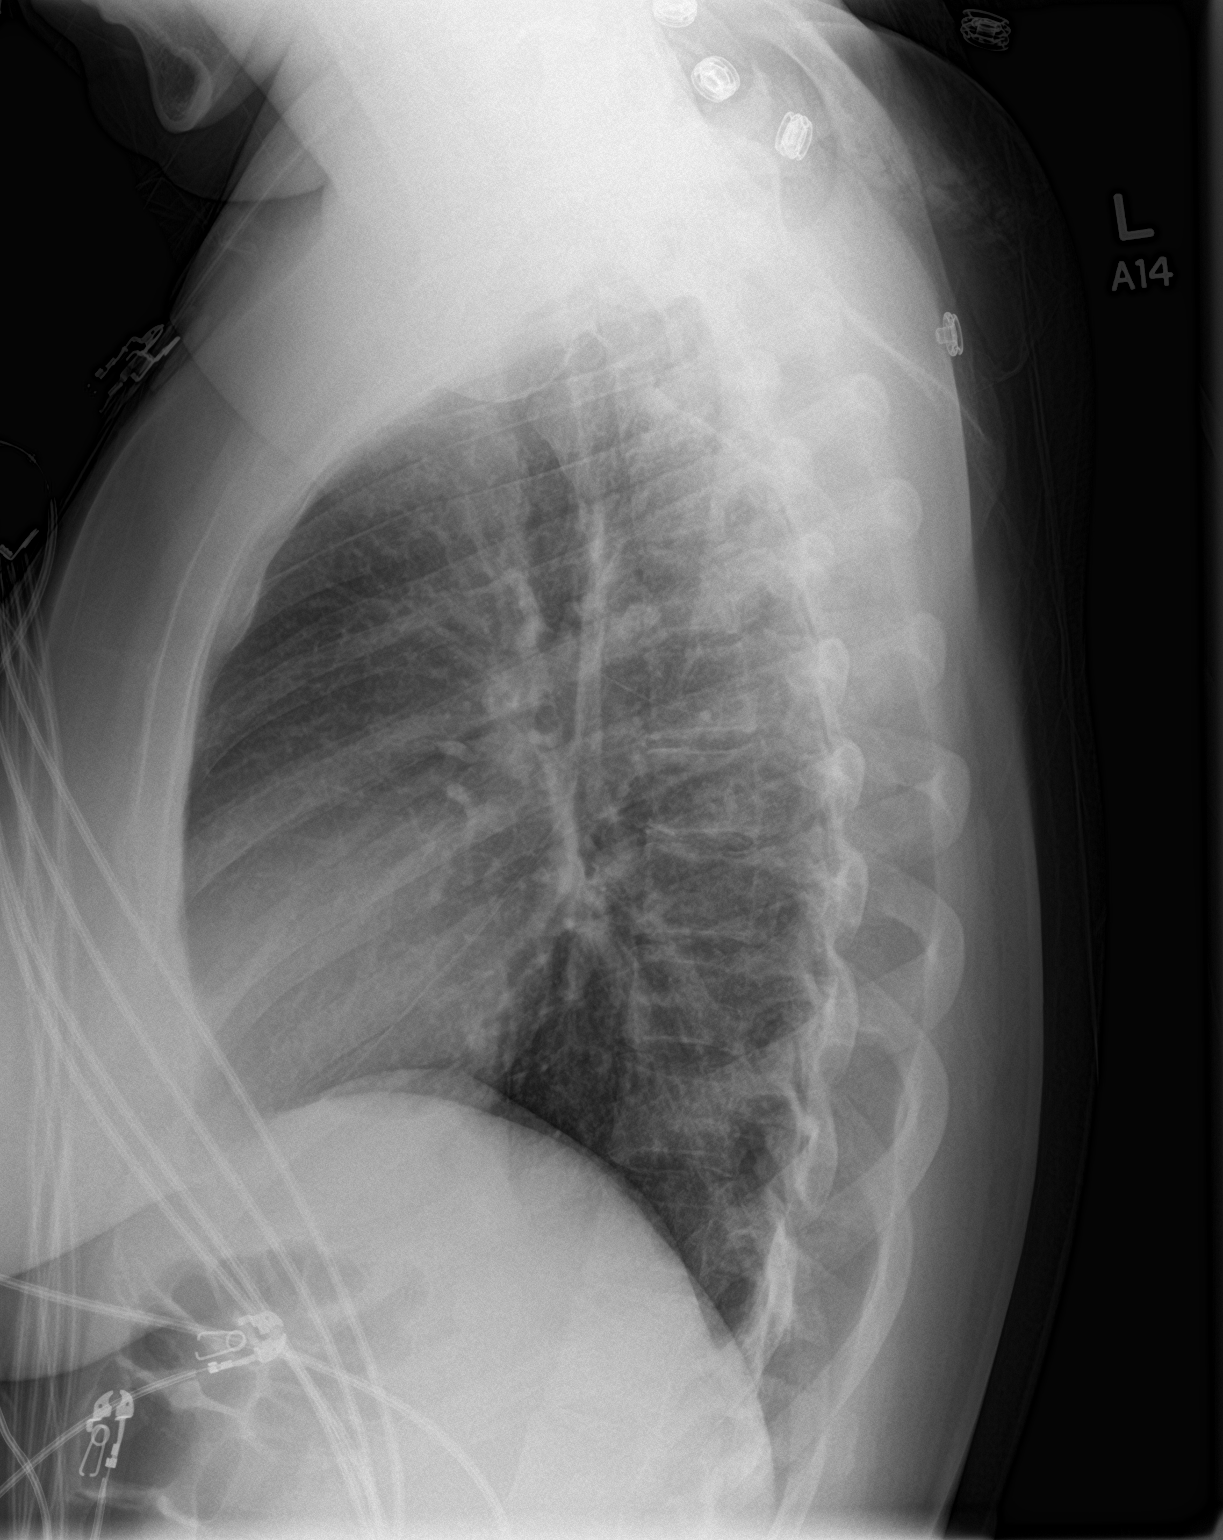

[2 of 2 positions shown; findings below may reference images not displayed]

FINDINGS: Mediastinum hilar structures normal. Mild bilateral interstitial
prominence suggesting mild pneumonitis noted. No prominent pleural
effusion. No pneumothorax. Heart size normal. No acute bony
abnormality.
IMPRESSION: Mild bilateral interstitial prominence suggesting mild pneumonitis.

## 2019-01-25 DIAGNOSIS — E669 Obesity, unspecified: Secondary | ICD-10-CM | POA: Diagnosis not present

## 2019-01-25 DIAGNOSIS — Z6838 Body mass index (BMI) 38.0-38.9, adult: Secondary | ICD-10-CM | POA: Diagnosis not present

## 2019-01-25 DIAGNOSIS — N926 Irregular menstruation, unspecified: Secondary | ICD-10-CM | POA: Diagnosis not present

## 2019-01-25 DIAGNOSIS — R5383 Other fatigue: Secondary | ICD-10-CM | POA: Diagnosis not present

## 2019-01-31 DIAGNOSIS — E669 Obesity, unspecified: Secondary | ICD-10-CM | POA: Diagnosis not present

## 2019-01-31 DIAGNOSIS — Z0001 Encounter for general adult medical examination with abnormal findings: Secondary | ICD-10-CM | POA: Diagnosis not present

## 2019-01-31 DIAGNOSIS — N926 Irregular menstruation, unspecified: Secondary | ICD-10-CM | POA: Diagnosis not present

## 2019-01-31 DIAGNOSIS — R5383 Other fatigue: Secondary | ICD-10-CM | POA: Diagnosis not present

## 2019-02-28 DIAGNOSIS — Z6838 Body mass index (BMI) 38.0-38.9, adult: Secondary | ICD-10-CM | POA: Diagnosis not present

## 2019-02-28 DIAGNOSIS — Z803 Family history of malignant neoplasm of breast: Secondary | ICD-10-CM | POA: Diagnosis not present

## 2019-02-28 DIAGNOSIS — Z808 Family history of malignant neoplasm of other organs or systems: Secondary | ICD-10-CM | POA: Diagnosis not present

## 2019-02-28 DIAGNOSIS — Z01419 Encounter for gynecological examination (general) (routine) without abnormal findings: Secondary | ICD-10-CM | POA: Diagnosis not present

## 2019-03-28 DIAGNOSIS — F4321 Adjustment disorder with depressed mood: Secondary | ICD-10-CM | POA: Diagnosis not present

## 2019-04-28 DIAGNOSIS — F4321 Adjustment disorder with depressed mood: Secondary | ICD-10-CM | POA: Diagnosis not present

## 2019-05-12 DIAGNOSIS — F4321 Adjustment disorder with depressed mood: Secondary | ICD-10-CM | POA: Diagnosis not present

## 2019-07-12 DIAGNOSIS — B029 Zoster without complications: Secondary | ICD-10-CM | POA: Diagnosis not present

## 2019-07-18 ENCOUNTER — Other Ambulatory Visit: Payer: BLUE CROSS/BLUE SHIELD

## 2019-07-18 ENCOUNTER — Ambulatory Visit: Payer: BC Managed Care – PPO | Attending: Internal Medicine

## 2019-07-25 ENCOUNTER — Other Ambulatory Visit: Payer: Self-pay

## 2019-07-25 ENCOUNTER — Ambulatory Visit: Payer: BC Managed Care – PPO | Attending: Internal Medicine

## 2019-07-25 DIAGNOSIS — Z20822 Contact with and (suspected) exposure to covid-19: Secondary | ICD-10-CM

## 2019-07-26 LAB — NOVEL CORONAVIRUS, NAA: SARS-CoV-2, NAA: NOT DETECTED

## 2019-10-27 ENCOUNTER — Ambulatory Visit: Payer: BC Managed Care – PPO

## 2019-11-22 DIAGNOSIS — R0789 Other chest pain: Secondary | ICD-10-CM | POA: Diagnosis not present

## 2019-11-22 DIAGNOSIS — Z20822 Contact with and (suspected) exposure to covid-19: Secondary | ICD-10-CM | POA: Diagnosis not present

## 2019-11-22 DIAGNOSIS — R05 Cough: Secondary | ICD-10-CM | POA: Diagnosis not present

## 2019-11-22 DIAGNOSIS — R071 Chest pain on breathing: Secondary | ICD-10-CM | POA: Diagnosis not present

## 2021-03-25 DIAGNOSIS — N911 Secondary amenorrhea: Secondary | ICD-10-CM | POA: Diagnosis not present

## 2021-04-02 DIAGNOSIS — Z3481 Encounter for supervision of other normal pregnancy, first trimester: Secondary | ICD-10-CM | POA: Diagnosis not present

## 2021-04-02 DIAGNOSIS — Z3685 Encounter for antenatal screening for Streptococcus B: Secondary | ICD-10-CM | POA: Diagnosis not present

## 2021-04-02 LAB — HEPATITIS C ANTIBODY: HCV Ab: NEGATIVE

## 2021-04-02 LAB — OB RESULTS CONSOLE ANTIBODY SCREEN: Antibody Screen: NEGATIVE

## 2021-04-02 LAB — OB RESULTS CONSOLE RUBELLA ANTIBODY, IGM: Rubella: IMMUNE

## 2021-04-02 LAB — OB RESULTS CONSOLE RPR: RPR: NONREACTIVE

## 2021-04-25 DIAGNOSIS — Z113 Encounter for screening for infections with a predominantly sexual mode of transmission: Secondary | ICD-10-CM | POA: Diagnosis not present

## 2021-04-25 DIAGNOSIS — Z23 Encounter for immunization: Secondary | ICD-10-CM | POA: Diagnosis not present

## 2021-04-25 DIAGNOSIS — Z124 Encounter for screening for malignant neoplasm of cervix: Secondary | ICD-10-CM | POA: Diagnosis not present

## 2021-04-30 DIAGNOSIS — Z3A12 12 weeks gestation of pregnancy: Secondary | ICD-10-CM | POA: Diagnosis not present

## 2021-04-30 DIAGNOSIS — Z3682 Encounter for antenatal screening for nuchal translucency: Secondary | ICD-10-CM | POA: Diagnosis not present

## 2021-04-30 DIAGNOSIS — Z3481 Encounter for supervision of other normal pregnancy, first trimester: Secondary | ICD-10-CM | POA: Diagnosis not present

## 2021-06-21 DIAGNOSIS — Z363 Encounter for antenatal screening for malformations: Secondary | ICD-10-CM | POA: Diagnosis not present

## 2021-06-21 DIAGNOSIS — Z3A20 20 weeks gestation of pregnancy: Secondary | ICD-10-CM | POA: Diagnosis not present

## 2021-09-09 ENCOUNTER — Encounter: Payer: BC Managed Care – PPO | Attending: Obstetrics & Gynecology | Admitting: Nutrition

## 2021-09-09 ENCOUNTER — Encounter: Payer: Self-pay | Admitting: Nutrition

## 2021-09-09 ENCOUNTER — Other Ambulatory Visit: Payer: Self-pay

## 2021-09-09 VITALS — Ht 65.0 in | Wt 233.8 lb

## 2021-09-09 DIAGNOSIS — Z713 Dietary counseling and surveillance: Secondary | ICD-10-CM | POA: Insufficient documentation

## 2021-09-09 DIAGNOSIS — Z3A00 Weeks of gestation of pregnancy not specified: Secondary | ICD-10-CM | POA: Insufficient documentation

## 2021-09-09 DIAGNOSIS — E663 Overweight: Secondary | ICD-10-CM

## 2021-09-09 DIAGNOSIS — O24410 Gestational diabetes mellitus in pregnancy, diet controlled: Secondary | ICD-10-CM

## 2021-09-09 DIAGNOSIS — O24419 Gestational diabetes mellitus in pregnancy, unspecified control: Secondary | ICD-10-CM | POA: Diagnosis present

## 2021-09-09 NOTE — Progress Notes (Signed)
° °  Patient was seen on 09-09-21 for Gestational Diabetes self-management class at the [facility] in [place]. The following learning objectives were met by the patient during this course:  States the definition of Gestational Diabetes States why dietary management is important in controlling blood glucose Describes the effects each nutrient has on blood glucose levels Demonstrates ability to create a balanced meal plan Demonstrates carbohydrate counting  States when to check blood glucose levels Demonstrates proper blood glucose monitoring techniques States the effect of stress and exercise on blood glucose levels States the importance of limiting caffeine and abstaining from alcohol and smoking   Blood glucose reading: FBS 98 this am  Goals  Follow Gestational Meal Plan Increase protein with meals and snacks Drink water 100 oz of water per day Get FBS less than 95,  BS after meals 2 hours less than 120 mg/dl   Patient instructed to monitor glucose levels: FBS: 60 - <90 1 hour: <140 2 hour: <120  *Patient received handouts: Nutrition Diabetes and Pregnancy Carbohydrate Counting List  Patient will be seen for follow-up in 1 week.

## 2021-09-09 NOTE — Patient Instructions (Addendum)
Goals  Follow Gestational Meal Plan Increase protein with meals and snacks Drink water 100 oz of water per day Get FBS less than 95,  BS after meals 2 hours less than 120 mg/dl

## 2021-09-17 ENCOUNTER — Encounter: Payer: BC Managed Care – PPO | Attending: Obstetrics & Gynecology | Admitting: Nutrition

## 2021-09-17 ENCOUNTER — Other Ambulatory Visit: Payer: Self-pay

## 2021-09-17 ENCOUNTER — Encounter: Payer: Self-pay | Admitting: Nutrition

## 2021-09-17 VITALS — Ht 65.0 in | Wt 232.0 lb

## 2021-09-17 DIAGNOSIS — O24410 Gestational diabetes mellitus in pregnancy, diet controlled: Secondary | ICD-10-CM | POA: Diagnosis not present

## 2021-09-17 DIAGNOSIS — E663 Overweight: Secondary | ICD-10-CM | POA: Insufficient documentation

## 2021-09-17 NOTE — Progress Notes (Signed)
Follow up for GDM. ? ?Started on Glyburide 2.5 mg once a day. ?FBS were in the  low 100's and now after the glyburide, they're 85-97 mg/dl. Post prandial BS have been less than 120's.  ?[redacted] weeks gestation. EDU 11-08-21. Expecting a girl. ?Doing well with meals and snacks. Would benefit from making sure she gets in 30 g CHO at each meals with protein. Sometimes she isn't getting in enough CHO at breakfast. ?Had a few low blood sugars symptoms when she first started Glyburide but is doing better now. ? ?Patient was seen on 09/17/21 for Gestational Diabetes self-management class at the [facility] in [place]. The following learning objectives were met by the patient during this course: ?Reviewed:  ?States the definition of Gestational Diabetes ?States why dietary management is important in controlling blood glucose ?Describes the effects each nutrient has on blood glucose levels ?Demonstrates ability to create a balanced meal plan ?Demonstrates carbohydrate counting  ?States when to check blood glucose levels ?Demonstrates proper blood glucose monitoring techniques ?States the effect of stress and exercise on blood glucose levels ?States the importance of limiting caffeine and abstaining from alcohol and smoking ? ? ?Blood glucose reading: FBS 97 this am bt have typically been less than 95  mg/dl/ ?Goals ?Aim to get in 30 g CHO at meals with protein. ?Drink water 100 oz of water per day ?Get FBS less than 95,  BS after meals 2 hours less than 120 mg/dl ? ? ?Patient instructed to monitor glucose levels: ?FBS: 60 - 95 mg/dl. ?1 hour: <140 ?2 hour: <120 ? ?*Patient received handouts: ?Nutrition Diabetes and Pregnancy ?Carbohydrate Counting List ? ?PRN. May need to increase Glyburide as needed if BS are elevated above target ranges. She verbalized understanding. ? ? ?

## 2021-09-17 NOTE — Patient Instructions (Addendum)
Aim  to get in 30 g CHO at meals with protein. ?Drink water 100 oz of water per day ?Get FBS less than 95,  BS after meals 2 hours less than 120 mg/dl ? ? ?Patient instructed to monitor glucose levels: ?FBS: 60 - 95 mg/dl. ?1 hour: <140 ?2 hour: <120 ? ?

## 2021-10-11 LAB — OB RESULTS CONSOLE GBS: GBS: NEGATIVE

## 2021-10-22 ENCOUNTER — Encounter (HOSPITAL_COMMUNITY): Payer: Self-pay | Admitting: *Deleted

## 2021-10-22 ENCOUNTER — Telehealth (HOSPITAL_COMMUNITY): Payer: Self-pay | Admitting: *Deleted

## 2021-10-22 NOTE — Telephone Encounter (Signed)
Preadmission screen  

## 2021-10-23 ENCOUNTER — Encounter (HOSPITAL_COMMUNITY): Payer: Self-pay | Admitting: *Deleted

## 2021-10-25 ENCOUNTER — Encounter (HOSPITAL_COMMUNITY): Payer: Self-pay | Admitting: Obstetrics and Gynecology

## 2021-10-25 ENCOUNTER — Inpatient Hospital Stay (EMERGENCY_DEPARTMENT_HOSPITAL)
Admission: AD | Admit: 2021-10-25 | Discharge: 2021-10-26 | Disposition: A | Discharge status: Home / Self Care | Payer: BC Managed Care – PPO | Source: Home / Self Care | Attending: Obstetrics and Gynecology | Admitting: Obstetrics and Gynecology

## 2021-10-25 DIAGNOSIS — O212 Late vomiting of pregnancy: Secondary | ICD-10-CM | POA: Insufficient documentation

## 2021-10-25 DIAGNOSIS — O219 Vomiting of pregnancy, unspecified: Secondary | ICD-10-CM

## 2021-10-25 DIAGNOSIS — O26893 Other specified pregnancy related conditions, third trimester: Secondary | ICD-10-CM | POA: Insufficient documentation

## 2021-10-25 DIAGNOSIS — O4292 Full-term premature rupture of membranes, unspecified as to length of time between rupture and onset of labor: Secondary | ICD-10-CM | POA: Diagnosis not present

## 2021-10-25 DIAGNOSIS — R030 Elevated blood-pressure reading, without diagnosis of hypertension: Secondary | ICD-10-CM | POA: Insufficient documentation

## 2021-10-25 DIAGNOSIS — Z3A38 38 weeks gestation of pregnancy: Secondary | ICD-10-CM | POA: Insufficient documentation

## 2021-10-25 DIAGNOSIS — O471 False labor at or after 37 completed weeks of gestation: Secondary | ICD-10-CM | POA: Insufficient documentation

## 2021-10-25 DIAGNOSIS — O479 False labor, unspecified: Secondary | ICD-10-CM

## 2021-10-25 NOTE — MAU Note (Signed)
.  [name] is a 31 y.o. at [redacted]w[redacted]d here in MAU reporting: with c/o contractions that started around 2000.  They are irregular and coming every 2-6 minutes.  Pt reports vomiting for the past 2 hours.  Denies VB or LOF.  +FM  ? ?Onset of complaint: 2000 ?Pain score: 0  ?Vitals:  ? 10/25/21 2221  ?BP: 140/76  ?Pulse: (!) 110  ?Resp: 18  ?Temp: (!) 97.5 ?F (36.4 ?C)  ?SpO2: 100%  ?   ?FHT:130 ?Lab orders placed from triage:   routine labor evaluation orders.  ?

## 2021-10-26 ENCOUNTER — Other Ambulatory Visit: Payer: Self-pay

## 2021-10-26 ENCOUNTER — Inpatient Hospital Stay (HOSPITAL_COMMUNITY)
Admission: AD | Admit: 2021-10-26 | Discharge: 2021-10-28 | DRG: 807 | Disposition: A | Discharge status: Home / Self Care | Payer: BC Managed Care – PPO | Attending: Obstetrics and Gynecology | Admitting: Obstetrics and Gynecology

## 2021-10-26 DIAGNOSIS — Z3A38 38 weeks gestation of pregnancy: Secondary | ICD-10-CM

## 2021-10-26 DIAGNOSIS — O34219 Maternal care for unspecified type scar from previous cesarean delivery: Secondary | ICD-10-CM | POA: Diagnosis present

## 2021-10-26 DIAGNOSIS — O4290 Premature rupture of membranes, unspecified as to length of time between rupture and onset of labor, unspecified weeks of gestation: Principal | ICD-10-CM | POA: Diagnosis present

## 2021-10-26 DIAGNOSIS — O4292 Full-term premature rupture of membranes, unspecified as to length of time between rupture and onset of labor: Principal | ICD-10-CM | POA: Diagnosis present

## 2021-10-26 DIAGNOSIS — O479 False labor, unspecified: Secondary | ICD-10-CM

## 2021-10-26 DIAGNOSIS — O24425 Gestational diabetes mellitus in childbirth, controlled by oral hypoglycemic drugs: Secondary | ICD-10-CM | POA: Diagnosis present

## 2021-10-26 DIAGNOSIS — R030 Elevated blood-pressure reading, without diagnosis of hypertension: Secondary | ICD-10-CM | POA: Diagnosis not present

## 2021-10-26 DIAGNOSIS — O219 Vomiting of pregnancy, unspecified: Secondary | ICD-10-CM

## 2021-10-26 LAB — COMPREHENSIVE METABOLIC PANEL
ALT: 13 U/L (ref 0–44)
AST: 18 U/L (ref 15–41)
Albumin: 2.7 g/dL — ABNORMAL LOW (ref 3.5–5.0)
Alkaline Phosphatase: 120 U/L (ref 38–126)
Anion gap: 10 (ref 5–15)
BUN: 9 mg/dL (ref 6–20)
CO2: 22 mmol/L (ref 22–32)
Calcium: 8.5 mg/dL — ABNORMAL LOW (ref 8.9–10.3)
Chloride: 105 mmol/L (ref 98–111)
Creatinine, Ser: 0.68 mg/dL (ref 0.44–1.00)
GFR, Estimated: >60 mL/min (ref >60)
Glucose, Bld: 107 mg/dL — ABNORMAL HIGH (ref 70–99)
Potassium: 3.8 mmol/L (ref 3.5–5.1)
Sodium: 137 mmol/L (ref 135–145)
Total Bilirubin: 0.9 mg/dL (ref 0.3–1.2)
Total Protein: 5.9 g/dL — ABNORMAL LOW (ref 6.5–8.1)

## 2021-10-26 LAB — TYPE AND SCREEN
ABO/RH(D): O POS
Antibody Screen: NEGATIVE

## 2021-10-26 LAB — CBC
HCT: 34.4 % — ABNORMAL LOW (ref 36.0–46.0)
HCT: 37.3 % (ref 36.0–46.0)
Hemoglobin: 11.6 g/dL — ABNORMAL LOW (ref 12.0–15.0)
Hemoglobin: 12 g/dL (ref 12.0–15.0)
MCH: 28.6 pg (ref 26.0–34.0)
MCH: 29.4 pg (ref 26.0–34.0)
MCHC: 32.2 g/dL (ref 30.0–36.0)
MCHC: 33.7 g/dL (ref 30.0–36.0)
MCV: 87.1 fL (ref 80.0–100.0)
MCV: 88.8 fL (ref 80.0–100.0)
Platelets: 144 10*3/uL — ABNORMAL LOW (ref 150–400)
Platelets: 153 10*3/uL (ref 150–400)
RBC: 3.95 MIL/uL (ref 3.87–5.11)
RBC: 4.2 MIL/uL (ref 3.87–5.11)
RDW: 15.1 % (ref 11.5–15.5)
RDW: 15.2 % (ref 11.5–15.5)
WBC: 14.8 10*3/uL — ABNORMAL HIGH (ref 4.0–10.5)
WBC: 8 10*3/uL (ref 4.0–10.5)
nRBC: 0 % (ref 0.0–0.2)
nRBC: 0 % (ref 0.0–0.2)

## 2021-10-26 LAB — POCT FERN TEST: POCT Fern Test: POSITIVE

## 2021-10-26 LAB — GLUCOSE, CAPILLARY: Glucose-Capillary: 107 mg/dL — ABNORMAL HIGH (ref 70–99)

## 2021-10-26 LAB — PROTEIN / CREATININE RATIO, URINE
Creatinine, Urine: 135.5 mg/dL
Protein Creatinine Ratio: 0.11 mg/mg{Cre} (ref 0.00–0.15)
Total Protein, Urine: 15 mg/dL

## 2021-10-26 MED ORDER — ACETAMINOPHEN 325 MG PO TABS: 650.0000 mg | ORAL_TABLET | ORAL | Status: DC | PRN

## 2021-10-26 MED ORDER — FENTANYL CITRATE (PF) 100 MCG/2ML IJ SOLN: 50.0000 ug | INTRAMUSCULAR | Status: DC | PRN

## 2021-10-26 MED ORDER — OXYCODONE-ACETAMINOPHEN 5-325 MG PO TABS: 2.0000 | ORAL_TABLET | ORAL | Status: DC | PRN

## 2021-10-26 MED ORDER — LACTATED RINGERS IV SOLN: 500.0000 mL | INTRAVENOUS | Status: DC | PRN

## 2021-10-26 MED ORDER — OXYTOCIN BOLUS FROM INFUSION
333.0000 mL | Freq: Once | INTRAVENOUS | Status: AC
Start: 2021-10-26 — End: 2021-10-27
  Administered 2021-10-27: 333 mL via INTRAVENOUS

## 2021-10-26 MED ORDER — ONDANSETRON 4 MG PO TBDP: 4.0000 mg | ORAL_TABLET | Freq: Three times a day (TID) | ORAL | 0 refills | Status: AC | PRN

## 2021-10-26 MED ORDER — FLEET ENEMA 7-19 GM/118ML RE ENEM: 1.0000 | ENEMA | RECTAL | Status: DC | PRN

## 2021-10-26 MED ORDER — SOD CITRATE-CITRIC ACID 500-334 MG/5ML PO SOLN: 30.0000 mL | ORAL | Status: DC | PRN

## 2021-10-26 MED ORDER — OXYTOCIN-SODIUM CHLORIDE 30-0.9 UT/500ML-% IV SOLN
2.5000 [IU]/h | INTRAVENOUS | Status: DC
  Administered 2021-10-27: 2.5 [IU]/h via INTRAVENOUS

## 2021-10-26 MED ORDER — TERBUTALINE SULFATE 1 MG/ML IJ SOLN: 0.2500 mg | Freq: Once | INTRAMUSCULAR | Status: DC | PRN

## 2021-10-26 MED ORDER — OXYTOCIN-SODIUM CHLORIDE 30-0.9 UT/500ML-% IV SOLN
1.0000 m[IU]/min | INTRAVENOUS | Status: DC
  Administered 2021-10-26: 2 m[IU]/min via INTRAVENOUS
  Filled 2021-10-26: qty 500

## 2021-10-26 MED ORDER — LIDOCAINE HCL (PF) 1 % IJ SOLN: 30.0000 mL | INTRAMUSCULAR | Status: DC | PRN

## 2021-10-26 MED ORDER — OXYCODONE-ACETAMINOPHEN 5-325 MG PO TABS: 1.0000 | ORAL_TABLET | ORAL | Status: DC | PRN

## 2021-10-26 MED ORDER — ONDANSETRON 4 MG PO TBDP
4.0000 mg | ORAL_TABLET | Freq: Once | ORAL | Status: AC
  Administered 2021-10-26: 4 mg via ORAL
  Filled 2021-10-26: qty 1

## 2021-10-26 MED ORDER — LACTATED RINGERS IV SOLN: INTRAVENOUS | Status: DC

## 2021-10-26 MED ORDER — ONDANSETRON HCL 4 MG/2ML IJ SOLN
4.0000 mg | Freq: Four times a day (QID) | INTRAMUSCULAR | Status: DC | PRN
  Administered 2021-10-27: 4 mg via INTRAVENOUS
  Filled 2021-10-26: qty 2

## 2021-10-26 NOTE — MAU Provider Note (Signed)
S: [name] is a 31 y.o. G3P2002 at [redacted]w[redacted]d  who presents to MAU today complaining of leaking of fluid since 1600. She denies vaginal bleeding. She denies contractions. She reports normal fetal movement.   ? ?O: BP 121/71 (BP Location: Right Arm)   Pulse (!) 127   Temp 97.9 ?F (36.6 ?C) (Oral)   Resp 18   Ht 5\' 5"  (1.651 m)   Wt 102.4 kg   SpO2 98%   BMI 37.56 kg/m?  ?GENERAL: Well-developed, well-nourished female in no acute distress.  ?HEAD: Normocephalic, atraumatic.  ?CHEST: Normal effort of breathing, regular heart rate ?ABDOMEN: Soft, nontender, gravid ?PELVIC: deferred, copious clear fluid leaking from introitus ? ?Cervical exam:  ?Dilation: 4 ?Effacement (%): 80 ?Station: -3 ?Presentation: Vertex ?Exam by:: [email or web address] CNM ? ? ?Fetal Monitoring: ?Baseline: 150 ?Variability: moderate ?Accelerations: 15x15 ?Decelerations: none ?Contractions: occasional uc's ? ?Fern: positive ? ?A: ?[facility] at [redacted]w[redacted]d  ?SROM ? ?P: ?Report given to RN to contact MD on call for further instructions ? ?[redacted]w[redacted]d, CNM ?10/26/2021 7:47 PM  ?

## 2021-10-26 NOTE — MAU Note (Signed)
.  [name] is a 31 y.o. at [redacted]w[redacted]d here in MAU reporting: possible SROM of clear fluid. Reports contractions are random and  slightly stronger that her [address].. Denies vaginal bleeding or bloody show. Does report given fluid on toilet tissue after urination. Endorses + fetal movement. ? ?Onset of complaint: 1630 ?Pain score: 4 ?There were no vitals filed for this visit.   ?FHT:150 ?Lab orders placed from triage:   mau labor ?

## 2021-10-26 NOTE — H&P (Signed)
[name] is a 31 y.o. female presenting for SROM about 4:00 pm. Some UCs. Pregancy complicated by A2GDM on glyburide 2.5mg  @ HS. U/S in office at 37 4/7 wks noted EFW 7# 1oz. Also Hx of C/S with pregnancy #1 and succesful VBAC for 9# 5oz baby with pregnancy #2. Plans another TOLAC with this pregnancy. ?OB History   ? ? Gravida  ?3  ? Para  ?2  ? Term  ?2  ? Preterm  ?0  ? AB  ?0  ? Living  ?2  ?  ? ? SAB  ?0  ? IAB  ?0  ? Ectopic  ?0  ? Multiple  ?0  ? Live Births  ?2  ?   ?  ?  ? ?Past Medical History:  ?Diagnosis Date  ? Abnormal Pap smear   ? had colpo with abnormal paps afterward  ? Anxiety   ? Asthma   ? childhood  ? Depression   ? Gestational diabetes   ? H/O varicella   ? Headache(784.0)   ? miagraine  ? Spinal headache   ? Vaginal Pap smear, abnormal   ? ?Past Surgical History:  ?Procedure Laterality Date  ? CESAREAN SECTION  07/26/2012  ? Procedure: CESAREAN SECTION;  Surgeon: Juluis Mire, MD;  Location: [facility];  Service: Obstetrics;  Laterality: N/A;  ? COLPOSCOPY    ? TYMPANOSTOMY TUBE PLACEMENT    ? wisdom teeth    ? ?Family History: family history includes Alcohol abuse in her maternal grandfather and paternal grandfather; Arthritis in her mother; Asthma in her mother; Cancer in her maternal aunt, maternal grandfather, and paternal uncle; Diabetes in her paternal aunt and paternal grandmother; Hyperlipidemia in her mother; Hypertension in her mother and paternal grandmother; Kidney Stones in her father; Stroke in her father; Thyroid disease in her maternal grandmother. ?Social History:  reports that she has never smoked. She has never used smokeless tobacco. She reports that she does not drink alcohol and does not use drugs. ? ? ?  ?Maternal Diabetes: Yes:  Diabetes Type:  Insulin/Medication controlled ?Genetic Screening: Normal ?Maternal Ultrasounds/Referrals: Normal ?Fetal Ultrasounds or other Referrals:  None ?Maternal Substance Abuse:  No ?Significant Maternal Medications:  Meds include:  Other: glyburide ?Significant Maternal Lab Results:  Group B Strep negative ?Other Comments:  None ? ?Review of Systems  ?Constitutional:  Negative for fever.  ?Eyes:  Negative for visual disturbance.  ?Gastrointestinal:  Negative for abdominal pain.  ?Neurological:  Negative for headaches.  ?Maternal Medical History:  ?Reason for admission: Rupture of membranes.  ? ?Fetal activity: Perceived fetal activity is normal.   ? ?Dilation: 4 ?Effacement (%): 80 ?Station: -3 ?Exam by:: Druscilla Brownie CNM ?Blood pressure 116/72, pulse (!) 123, temperature 97.9 ?F (36.6 ?C), temperature source Oral, resp. rate 18, height 5\' 5"  (1.651 m), weight 102.4 kg, SpO2 99 %, unknown if currently breastfeeding. ?Maternal Exam:  ?Abdomen: Fetal presentation: vertex ? ? ?Fetal Exam ?Fetal State Assessment: Category I - tracings are normal. ? ?Physical Exam ?Cardiovascular:  ?   Rate and Rhythm: Normal rate.  ?Pulmonary:  ?   Effort: Pulmonary effort is normal.  ?  ?Prenatal labs: ?ABO, Rh: O/Positive/-- (09/20 0000) ?Antibody: Negative (09/20 0000) ?Rubella: Immune (09/20 0000) ?RPR: Nonreactive (09/20 0000)  ?HBsAg: Negative (09/20 0000)  ?HIV: Non-reactive (09/20 0000)  ?GBS: Negative/-- (03/31 0000)  ? ?Assessment/Plan: ?31 yo G3P2 @ 38 1/7 weeks ?PROM ?A2GDM ?Hx of C/S pregnancy #1 and Hx of successful VBAC with pregnancy #2 ?  Admit L&D  ? ?Roselle Locus II ?10/26/2021, 8:10 PM ? ? ? ? ?

## 2021-10-26 NOTE — Discharge Instructions (Signed)
Reasons to return to MAU at [facility]:  1.  Contractions are  5 minutes apart or less, each last 1 minute, these have been going on for 1-2 hours, and you cannot walk or talk during them 2.  You have a large gush of fluid, or a trickle of fluid that will not stop and you have to wear a pad 3.  You have bleeding that is bright red, heavier than spotting--like menstrual bleeding (spotting can be normal in early labor or after a check of your cervix) 4.  You do not feel the baby moving like he/she normally does  

## 2021-10-26 NOTE — MAU Provider Note (Signed)
Chief Complaint:  Contractions ? ? Event Date/Time  ? First Provider Initiated Contact with Patient 10/26/21 0011   ?  ? ?HPI: [name] is a 31 y.o. G3P2002 at [redacted]w[redacted]d who presents to maternity admissions reporting contractions. She had elevated blood pressures x 2 in MAU. She denies h/a, epigastric pain or visual disturbances. She has no hx of HTN during or before pregnancy.   ?She reports good fetal movement.  ? ? ?HPI ? ?Past Medical History: ?Past Medical History:  ?Diagnosis Date  ? Abnormal Pap smear   ? had colpo with abnormal paps afterward  ? Anxiety   ? Asthma   ? childhood  ? Depression   ? Gestational diabetes   ? H/O varicella   ? Headache(784.0)   ? miagraine  ? Spinal headache   ? Vaginal Pap smear, abnormal   ? ? ?Past obstetric history: ?OB History  ?Gravida Para Term Preterm AB Living  ?3 2 2  0 0 2  ?SAB IAB Ectopic Multiple Live Births  ?0 0 0 0 2  ?  ?# Outcome Date GA Lbr Len/2nd Weight Sex Delivery Anes PTL Lv  ?3 Current           ?2 Term 01/11/18 [redacted]w[redacted]d 06:25 / 01:08 4224 g M VBAC EPI  LIV  ?1 Term 07/26/12 [redacted]w[redacted]d 02:39 / 06:08 3740 g F CS-LTranv EPI  LIV  ? ? ?Past Surgical History: ?Past Surgical History:  ?Procedure Laterality Date  ? CESAREAN SECTION  07/26/2012  ? Procedure: CESAREAN SECTION;  Surgeon: 07/28/2012, MD;  Location: [facility];  Service: Obstetrics;  Laterality: N/A;  ? COLPOSCOPY    ? TYMPANOSTOMY TUBE PLACEMENT    ? wisdom teeth    ? ? ?Family History: ?Family History  ?Problem Relation Age of Onset  ? Asthma Mother   ? Hyperlipidemia Mother   ? Hypertension Mother   ? Arthritis Mother   ? Kidney Stones Father   ? Stroke Father   ? Cancer Maternal Aunt   ?     breast  ? Diabetes Paternal Aunt   ? Cancer Paternal Uncle   ?     leukemia  ? Thyroid disease Maternal Grandmother   ? Cancer Maternal Grandfather   ?     lung  ? Alcohol abuse Maternal Grandfather   ? Hypertension Paternal Grandmother   ? Diabetes Paternal Grandmother   ? Alcohol abuse Paternal Grandfather    ? ? ?Social History: ?Social History  ? ?Tobacco Use  ? Smoking status: Never  ? Smokeless tobacco: Never  ?Vaping Use  ? Vaping Use: Never used  ?Substance Use Topics  ? Alcohol use: No  ? Drug use: No  ? ? ?Allergies:  ?Allergies  ?Allergen Reactions  ? Azithromycin Other (See Comments)  ?  Severe stomach cramps  ? ? ?Meds:  ?No medications prior to admission.  ? ? ?ROS:  ?Review of Systems  ?Constitutional:  Negative for chills, fatigue and fever.  ?Eyes:  Negative for visual disturbance.  ?Respiratory:  Negative for shortness of breath.   ?Cardiovascular:  Negative for chest pain.  ?Gastrointestinal:  Positive for abdominal pain. Negative for nausea and vomiting.  ?Genitourinary:  Negative for difficulty urinating, dysuria, flank pain, pelvic pain, vaginal bleeding, vaginal discharge and vaginal pain.  ?Musculoskeletal:  Positive for back pain.  ?Neurological:  Negative for dizziness and headaches.  ?Psychiatric/Behavioral: Negative.    ? ? ?I have reviewed patient's Past Medical Hx, Surgical Hx, Family Hx,  Social Hx, medications and allergies.  ? ?Physical Exam  ?Patient Vitals for the past 24 hrs: ? BP Temp Temp src Pulse Resp SpO2 Height Weight  ?10/26/21 0333 117/74 98.8 ?F (37.1 ?C) Oral (!) 108 16 98 % -- --  ?10/26/21 0015 134/82 -- -- (!) 103 -- 99 % -- --  ?10/26/21 0000 126/89 -- -- 99 -- 99 % -- --  ?10/25/21 2345 131/74 -- -- (!) 106 -- 100 % -- --  ?10/25/21 2330 131/77 -- -- (!) 105 -- 98 % -- --  ?10/25/21 2315 126/67 -- -- (!) 102 -- 98 % -- --  ?10/25/21 2300 126/71 -- -- (!) 110 -- 100 % -- --  ?10/25/21 2245 (!) 131/96 -- -- (!) 129 -- 99 % -- --  ?10/25/21 2230 131/73 -- -- (!) 114 -- 98 % -- --  ?10/25/21 2221 140/76 (!) 97.5 ?F (36.4 ?C) Oral (!) 110 18 100 % 5\' 5"  (1.651 m) 104 kg  ? ?Constitutional: Well-developed, well-nourished female in no acute distress.  ?Cardiovascular: normal rate ?Respiratory: normal effort ?GI: Abd soft, non-tender, gravid appropriate for gestational age.  ?MS:  Extremities nontender, no edema, normal ROM ?Neurologic: Alert and oriented x 4.  ?GU: Neg CVAT. ? ? ?Dilation: 2.5 ?Effacement (%): 60 ?Cervical Position: Posterior ?Station: -3 ?Exam by:: J.Bellamy,RN ? ?FHT:  Baseline 135 , moderate variability, accelerations present, no decelerations ?Contractions: q 3-4 mins ?  ?Labs: ?Results for orders placed or performed during the hospital encounter of 10/25/21 (from the past 24 hour(s))  ?CBC     Status: Abnormal  ? Collection Time: 10/25/21 11:36 PM  ?Result Value Ref Range  ? WBC 14.8 (H) 4.0 - 10.5 K/uL  ? RBC 4.20 3.87 - 5.11 MIL/uL  ? Hemoglobin 12.0 12.0 - 15.0 g/dL  ? HCT 37.3 36.0 - 46.0 %  ? MCV 88.8 80.0 - 100.0 fL  ? MCH 28.6 26.0 - 34.0 pg  ? MCHC 32.2 30.0 - 36.0 g/dL  ? RDW 15.2 11.5 - 15.5 %  ? Platelets 153 150 - 400 K/uL  ? nRBC 0.0 0.0 - 0.2 %  ?Comprehensive metabolic panel     Status: Abnormal  ? Collection Time: 10/25/21 11:36 PM  ?Result Value Ref Range  ? Sodium 137 135 - 145 mmol/L  ? Potassium 3.8 3.5 - 5.1 mmol/L  ? Chloride 105 98 - 111 mmol/L  ? CO2 22 22 - 32 mmol/L  ? Glucose, Bld 107 (H) 70 - 99 mg/dL  ? BUN 9 6 - 20 mg/dL  ? Creatinine, Ser 0.68 0.44 - 1.00 mg/dL  ? Calcium 8.5 (L) 8.9 - 10.3 mg/dL  ? Total Protein 5.9 (L) 6.5 - 8.1 g/dL  ? Albumin 2.7 (L) 3.5 - 5.0 g/dL  ? AST 18 15 - 41 U/L  ? ALT 13 0 - 44 U/L  ? Alkaline Phosphatase 120 38 - 126 U/L  ? Total Bilirubin 0.9 0.3 - 1.2 mg/dL  ? GFR, Estimated >60 >60 mL/min  ? Anion gap 10 5 - 15  ? ?O/Positive/-- (09/20 0000) ? ?Imaging:  ?No results found. ? ?MAU Course/MDM: ?Orders Placed This Encounter  ?Procedures  ? CBC  ? Comprehensive metabolic panel  ? Protein / creatinine ratio, urine  ? Contraction - monitoring  ? External fetal heart monitoring  ? Vaginal exam  ? Discharge patient  ?  ?Meds ordered this encounter  ?Medications  ? ondansetron (ZOFRAN-ODT) disintegrating tablet 4 mg  ? ondansetron (ZOFRAN-ODT) 4 MG  disintegrating tablet  ?  Sig: Take 1 tablet (4 mg total) by mouth  every 8 (eight) hours as needed for nausea or vomiting.  ?  Dispense:  20 tablet  ?  Refill:  0  ?  Order Specific Question:   Supervising Provider  ?  Answer:   Warden FillersBASS, LAWRENCE A [[id number]]  ?  ? ?NST reviewed and reactive ?No evidence of active labor with unchanged cervix ?Borderline elevated BP x 2 today, with several normal BPs, no s/sx of PEC ?Pt with appt on 10/29/21 in office ?PEC and labor precautions reviewed ?D/C home ? ? ?Assessment: ?1. Elevated blood pressure reading without diagnosis of hypertension   ?2. Braxton Hicks contractions   ?3. [redacted] weeks gestation of pregnancy   ?4. Nausea and vomiting during pregnancy   ? ? ?Plan: ?Discharge home ?Labor precautions and fetal kick counts ? Follow-up Information   ? ? [place] Follow up.   ?Why: As scheduled ?Contact information: ?[address] ?[address] ?[place] [id number] ?[phone] ? ? ?  ?  ? ? [facility] Follow up.   ?Specialty: Obstetrics and Gynecology ?Why: As needed for signs of labor or emergencies ?Contact information: ?[address] ?[id number] mc ?[place] [id number] ?[phone] ? ?  ?  ? ?  ?  ? ?  ? ?Allergies as of 10/26/2021   ? ?   Reactions  ? Azithromycin Other (See Comments)  ? Severe stomach cramps  ? ?  ? ?  ?Medication List  ?  ? ?STOP taking these medications   ? ?ibuprofen 600 MG tablet ?Commonly known as: ADVIL ?  ?oxyCODONE-acetaminophen 5-325 MG tablet ?Commonly known as: PERCOCET/ROXICET ?  ? ?  ? ?TAKE these medications   ? ?glimepiride 2 MG tablet ?Commonly known as: AMARYL ?Take 2.5 mg by mouth daily with breakfast. ?  ?ondansetron 4 MG disintegrating tablet ?Commonly known as: ZOFRAN-ODT ?Take 1 tablet (4 mg total) by mouth every 8 (eight) hours as needed for nausea or vomiting. ?  ?prenatal multivitamin Tabs tablet ?Take 2 tablets by mouth daily. gummy ?  ? ?  ? ? ?Misty StanleyLisa Leftwich-Kirby ?Certified Nurse-Midwife ?10/26/2021 ?8:48 AM  ?

## 2021-10-27 ENCOUNTER — Inpatient Hospital Stay (HOSPITAL_COMMUNITY): Payer: BC Managed Care – PPO | Admitting: Anesthesiology

## 2021-10-27 ENCOUNTER — Encounter (HOSPITAL_COMMUNITY): Payer: Self-pay | Admitting: Obstetrics and Gynecology

## 2021-10-27 LAB — GLUCOSE, CAPILLARY
Glucose-Capillary: 113 mg/dL — ABNORMAL HIGH (ref 70–99)
Glucose-Capillary: 94 mg/dL (ref 70–99)

## 2021-10-27 LAB — RPR: RPR Ser Ql: NONREACTIVE

## 2021-10-27 MED ORDER — ONDANSETRON HCL 4 MG PO TABS
4.0000 mg | ORAL_TABLET | ORAL | Status: DC | PRN
Start: 2021-10-27 — End: 2021-10-28

## 2021-10-27 MED ORDER — EPHEDRINE 5 MG/ML INJ: 10.0000 mg | INTRAVENOUS | Status: DC | PRN

## 2021-10-27 MED ORDER — COCONUT OIL OIL: 1.0000 "application " | TOPICAL_OIL | Status: DC | PRN

## 2021-10-27 MED ORDER — LACTATED RINGERS IV SOLN
500.0000 mL | Freq: Once | INTRAVENOUS | Status: AC
  Administered 2021-10-27: 500 mL via INTRAVENOUS

## 2021-10-27 MED ORDER — SIMETHICONE 80 MG PO CHEW: 80.0000 mg | CHEWABLE_TABLET | ORAL | Status: DC | PRN

## 2021-10-27 MED ORDER — PRENATAL MULTIVITAMIN CH
1.0000 | ORAL_TABLET | Freq: Every day | ORAL | Status: DC
  Administered 2021-10-27: 1 via ORAL
  Filled 2021-10-27: qty 1

## 2021-10-27 MED ORDER — TETANUS-DIPHTH-ACELL PERTUSSIS 5-2.5-18.5 LF-MCG/0.5 IM SUSY: 0.5000 mL | PREFILLED_SYRINGE | Freq: Once | INTRAMUSCULAR | Status: DC

## 2021-10-27 MED ORDER — FENTANYL-BUPIVACAINE-NACL 0.5-0.125-0.9 MG/250ML-% EP SOLN
12.0000 mL/h | EPIDURAL | Status: DC | PRN
  Administered 2021-10-27: 12 mL/h via EPIDURAL
  Filled 2021-10-27: qty 250

## 2021-10-27 MED ORDER — OXYCODONE HCL 5 MG PO TABS: 5.0000 mg | ORAL_TABLET | ORAL | Status: DC | PRN

## 2021-10-27 MED ORDER — DIPHENHYDRAMINE HCL 50 MG/ML IJ SOLN: 12.5000 mg | INTRAMUSCULAR | Status: DC | PRN

## 2021-10-27 MED ORDER — LIDOCAINE-EPINEPHRINE (PF) 1.5 %-1:200000 IJ SOLN
INTRAMUSCULAR | Status: DC | PRN
  Administered 2021-10-27: 5 mL via EPIDURAL

## 2021-10-27 MED ORDER — PHENYLEPHRINE 40 MCG/ML (10ML) SYRINGE FOR IV PUSH (FOR BLOOD PRESSURE SUPPORT): 80.0000 ug | PREFILLED_SYRINGE | INTRAVENOUS | Status: DC | PRN

## 2021-10-27 MED ORDER — DIBUCAINE (PERIANAL) 1 % EX OINT: 1.0000 "application " | TOPICAL_OINTMENT | CUTANEOUS | Status: DC | PRN

## 2021-10-27 MED ORDER — ZOLPIDEM TARTRATE 5 MG PO TABS: 5.0000 mg | ORAL_TABLET | Freq: Every evening | ORAL | Status: DC | PRN

## 2021-10-27 MED ORDER — ONDANSETRON HCL 4 MG/2ML IJ SOLN
4.0000 mg | INTRAMUSCULAR | Status: DC | PRN
Start: 2021-10-27 — End: 2021-10-28

## 2021-10-27 MED ORDER — ACETAMINOPHEN 325 MG PO TABS: 650.0000 mg | ORAL_TABLET | ORAL | Status: DC | PRN

## 2021-10-27 MED ORDER — BENZOCAINE-MENTHOL 20-0.5 % EX AERO
1.0000 "application " | INHALATION_SPRAY | CUTANEOUS | Status: DC | PRN
  Administered 2021-10-27: 1 via TOPICAL
  Filled 2021-10-27: qty 56

## 2021-10-27 MED ORDER — OXYCODONE HCL 5 MG PO TABS: 10.0000 mg | ORAL_TABLET | ORAL | Status: DC | PRN

## 2021-10-27 MED ORDER — DIPHENHYDRAMINE HCL 25 MG PO CAPS: 25.0000 mg | ORAL_CAPSULE | Freq: Four times a day (QID) | ORAL | Status: DC | PRN

## 2021-10-27 MED ORDER — WITCH HAZEL-GLYCERIN EX PADS
1.0000 "application " | MEDICATED_PAD | CUTANEOUS | Status: DC | PRN
  Administered 2021-10-27: 1 via TOPICAL

## 2021-10-27 MED ORDER — IBUPROFEN 600 MG PO TABS
600.0000 mg | ORAL_TABLET | Freq: Four times a day (QID) | ORAL | Status: DC
  Administered 2021-10-27 – 2021-10-28 (×4): 600 mg via ORAL
  Filled 2021-10-27 (×5): qty 1

## 2021-10-27 MED ORDER — SENNOSIDES-DOCUSATE SODIUM 8.6-50 MG PO TABS
2.0000 | ORAL_TABLET | ORAL | Status: DC
  Administered 2021-10-27: 2 via ORAL
  Filled 2021-10-27: qty 2

## 2021-10-27 NOTE — Progress Notes (Signed)
FHT cat one ?UCs q2-4 min ?Cx 4/60/-3/vtx ?IUPC placed ?

## 2021-10-27 NOTE — Progress Notes (Signed)
Delivery Note ?At 8:46 AM a viable female was delivered via Vaginal, Spontaneous (Presentation: Left Occiput Anterior).  APGAR: , ; weight  .   ?Placenta status: Spontaneous, Intact.  Cord: 3 vessels with the following complications: None.  Cord pH:  ? ?Anesthesia: Epidural ?Episiotomy: None ?Lacerations: 2nd degree ML repaired ?Suture Repair: 2.0 vicryl rapide, 2-0 chromic ?Est. Blood Loss (mL):  150 ? ?Mom to postpartum.  Baby to Couplet care / Skin to Skin. ? ?Roselle Locus II ?10/27/2021, 9:06 AM ? ?

## 2021-10-27 NOTE — Anesthesia Preprocedure Evaluation (Addendum)
Anesthesia Evaluation  ?Patient identified by MRN, date of birth, ID band ?Patient awake ? ? ? ?Reviewed: ?Allergy & Precautions, NPO status , Patient's Chart, lab work & pertinent test results ? ?History of Anesthesia Complications ?(+) POST - OP SPINAL HEADACHE and history of anesthetic complications ? ?Airway ?Mallampati: II ? ?TM Distance: >3 FB ?Neck ROM: Full ? ? ? Dental ?no notable dental hx. ? ?  ?Pulmonary ?asthma ,  ?  ?Pulmonary exam normal ? ? ? ? ? ? ? Cardiovascular ?negative cardio ROS ? ? ?Rhythm:Regular Rate:Normal ? ? ?  ?Neuro/Psych ? Headaches, Anxiety Depression   ? GI/Hepatic ?negative GI ROS, Neg liver ROS,   ?Endo/Other  ?diabetes ? Renal/GU ?negative Renal ROS  ?negative genitourinary ?  ?Musculoskeletal ? ? Abdominal ?Normal abdominal exam  (+)   ?Peds ? Hematology ?negative hematology ROS ?(+)   ?Anesthesia Other Findings ? ? Reproductive/Obstetrics ?(+) Pregnancy ? ?  ? ? ? ? ? ? ? ? ? ? ? ? ? ?  ?  ? ? ? ? ? ? ? ? ?Anesthesia Physical ?Anesthesia Plan ? ?ASA: 3 ? ?Anesthesia Plan: Epidural  ? ?Post-op Pain Management:   ? ?Induction:  ? ?PONV Risk Score and Plan: 2 and Treatment may vary due to age or medical condition ? ?Airway Management Planned: Natural Airway ? ?Additional Equipment: None ? ?Intra-op Plan:  ? ?Post-operative Plan:  ? ?Informed Consent: I have reviewed the patients History and Physical, chart, labs and discussed the procedure including the risks, benefits and alternatives for the proposed anesthesia with the patient or authorized representative who has indicated his/her understanding and acceptance.  ? ? ? ?Dental advisory given ? ?Plan Discussed with:  ? ?Anesthesia Plan Comments:   ? ? ? ? ? ?Anesthesia Quick Evaluation ? ?

## 2021-10-27 NOTE — Anesthesia Procedure Notes (Signed)
Epidural ?Patient location during procedure: OB ?Start time: 10/27/2021 1:30 AM ?End time: 10/27/2021 1:37 AM ? ?Staffing ?Anesthesiologist: Atilano Median, DO ?Performed: anesthesiologist  ? ?Preanesthetic Checklist ?Completed: patient identified, IV checked, site marked, risks and benefits discussed, surgical consent, monitors and equipment checked, pre-op evaluation and timeout performed ? ?Epidural ?Patient position: sitting ?Prep: ChloraPrep ?Patient monitoring: heart rate, continuous pulse ox and blood pressure ?Approach: midline ?Location: L3-L4 ?Injection technique: LOR saline ? ?Needle:  ?Needle type: Tuohy  ?Needle gauge: 17 G ?Needle length: 9 cm ?Needle insertion depth: 8 cm ?Catheter type: closed end flexible ?Catheter size: 20 Guage ?Catheter at skin depth: 12 cm ?Test dose: negative and 1.5% lidocaine with Epi 1:200 K ? ?Assessment ?Events: blood not aspirated, injection not painful, no injection resistance and no paresthesia ? ?Additional Notes ?Patient identified. Risks/Benefits/Options discussed with patient including but not limited to bleeding, infection, nerve damage, paralysis, failed block, incomplete pain control, headache, blood pressure changes, nausea, vomiting, reactions to medications, itching and postpartum back pain. Confirmed with bedside nurse the patient's most recent platelet count. Confirmed with patient that they are not currently taking any anticoagulation, have any bleeding history or any family history of bleeding disorders. Patient expressed understanding and wished to proceed. All questions were answered. Sterile technique was used throughout the entire procedure. Please see nursing notes for vital signs. Test dose was given through epidural catheter and negative prior to continuing to dose epidural or start infusion. Warning signs of high block given to the patient including shortness of breath, tingling/numbness in hands, complete motor block, or any concerning  symptoms with instructions to call for help. Patient was given instructions on fall risk and not to get out of bed. All questions and concerns addressed with instructions to call with any issues or inadequate analgesia.   ? ?Reason for block:procedure for pain ? ? ? ?

## 2021-10-27 NOTE — Lactation Note (Signed)
This note was copied from a baby's chart. ?Lactation Consultation Note ? ?Patient Name: [name] ?Today's Date: 10/27/2021 ?Reason for consult: L&D Initial assessment;Early term 37-38.6wks;Breastfeeding assistance;Other (Comment) (LC LD visit at 37 mins PP , Baby wide awake, STS on mom chest. LC offered to assist to latch , mom receptive. baby Latched after several attempts and baby still feeding at 12 mins m few swallows. mom aware she will be seen by [facility] on MBU.) ?Age:32 mins PP . - Latch score 7  ? ? ?Maternal Data ?Does the patient have breastfeeding experience prior to this delivery?: Yes ?How long did the patient breastfeed?: per mom 1st baby attempt, 2nd baby 2 months , ? ?Feeding ?Mother's Current Feeding Choice: Breast Milk ? ?LATCH Score ?Latch: Repeated attempts needed to sustain latch, nipple held in mouth throughout feeding, stimulation needed to elicit sucking reflex. ? ?Audible Swallowing: A few with stimulation ? ?Type of Nipple: Everted at rest and after stimulation ? ?Comfort (Breast/Nipple): Soft / non-tender ? ?Hold (Positioning): Assistance needed to correctly position infant at breast and maintain latch. ? ?LATCH Score: 7 ? ? ?Lactation Tools Discussed/Used ?  ? ?Interventions ?Interventions: Breast feeding basics reviewed;Assisted with latch;Skin to skin;Reverse pressure;Breast compression;Adjust position;Support pillows;Position options;Education ? ?Discharge ?  ? ?Consult Status ?Consult Status: Follow-up from L&D ?Date: 10/27/21 ?Follow-up type: In-patient ? ? ? ?[name] ?10/27/2021, 9:45 AM ? ? ? ?

## 2021-10-27 NOTE — Plan of Care (Signed)
Pt demonstrated understanding 

## 2021-10-27 NOTE — Lactation Note (Signed)
This note was copied from a baby's chart. ?Lactation Consultation Note ? ?Patient Name: [name] ?Today's Date: 10/27/2021 ?Reason for consult: Initial assessment;Mother's request;Difficult latch;Early term 37-38.6wks;Breastfeeding assistance;Maternal endocrine disorder ?Age:32 hours ?Infant episode of hypoglycemia and recently given glucose gel and 10 ml of DBM prior to [facility] arrival.  ? ?Plan 1 to feed based on cues 8-12x 24hr period.  ?2. Mom to offer breast and look for signs of milk transfer.  ?3. Mom to supplement with EBM first followed by DBM 5-7 ml per feeding. If infant not latching, Mom can offer more. ?4. DEPBP q 3hrs for 15 min  ?5. I and O sheet reviewed.  ?All questions answered at the end of the visit.  ?Maternal Data ?Has patient been taught Hand Expression?: Yes ?Does the patient have breastfeeding experience prior to this delivery?: Yes ?How long did the patient breastfeed?: 2 months breast and bottle feeding breast milk ? ?Feeding ?Mother's Current Feeding Choice: Breast Milk and Donor Milk ?Nipple Type: Slow - flow ? ?LATCH Score ?  ? ?  ? ?  ? ?  ? ?  ? ?  ? ? ?Lactation Tools Discussed/Used ?Tools: Pump;Flanges (LC reviewed pump parts and assessed flange size. Mom laying supine to stablize blood pressure and will pump after next feeding once she is feeling better.) ?Flange Size: 24 ?Breast pump type: Double-Electric Breast Pump ?Pump Education: Setup, frequency, and cleaning;Milk Storage ?Reason for Pumping: increase stimulation ?Pumping frequency: every 3 hrs for 15 min ? ?Interventions ?Interventions: Breast feeding basics reviewed;Assisted with latch;Skin to skin;Breast massage;Hand express;Breast compression;Adjust position;Support pillows;Position options;Expressed milk;DEBP;Pace feeding;Education;Theme park manager brochure ? ?Discharge ?Pump: DEBP ? ?Consult Status ?Consult Status: Follow-up ?Date: 10/28/21 ?Follow-up type:  In-patient ? ? ? ?[name]  [name] ?10/27/2021, 4:12 PM ? ? ? ?

## 2021-10-28 LAB — CBC
HCT: 32.7 % — ABNORMAL LOW (ref 36.0–46.0)
Hemoglobin: 10.7 g/dL — ABNORMAL LOW (ref 12.0–15.0)
MCH: 29.3 pg (ref 26.0–34.0)
MCHC: 32.7 g/dL (ref 30.0–36.0)
MCV: 89.6 fL (ref 80.0–100.0)
Platelets: 142 10*3/uL — ABNORMAL LOW (ref 150–400)
RBC: 3.65 MIL/uL — ABNORMAL LOW (ref 3.87–5.11)
RDW: 15.5 % (ref 11.5–15.5)
WBC: 8.8 10*3/uL (ref 4.0–10.5)
nRBC: 0 % (ref 0.0–0.2)

## 2021-10-28 MED ORDER — IBUPROFEN 600 MG PO TABS: 600.0000 mg | ORAL_TABLET | Freq: Four times a day (QID) | ORAL | 0 refills | Status: AC

## 2021-10-28 MED ORDER — ACETAMINOPHEN 325 MG PO TABS
650.0000 mg | ORAL_TABLET | ORAL | 0 refills | Status: AC | PRN
Start: 2021-10-28 — End: ?

## 2021-10-28 NOTE — Anesthesia Postprocedure Evaluation (Signed)
Anesthesia Post Note ? ?Patient: OYINDAMOLA KEY ? ?Procedure(s) Performed: AN AD HOC LABOR EPIDURAL ? ?  ? ?Patient location during evaluation: Mother Baby ?Anesthesia Type: Epidural ?Level of consciousness: awake ?Pain management: satisfactory to patient ?Vital Signs Assessment: post-procedure vital signs reviewed and stable ?Respiratory status: spontaneous breathing ?Cardiovascular status: stable ?Anesthetic complications: no ? ? ?No notable events documented. ? ?Last Vitals:  ?Vitals:  ? 10/28/21 0030 10/28/21 0615  ?BP: 100/64 106/65  ?Pulse: 89 72  ?Resp: 15   ?Temp: 37.2 ?C   ?SpO2: 98% 97%  ?  ?Last Pain:  ?Vitals:  ? 10/28/21 0748  ?TempSrc:   ?PainSc: Asleep  ? ?Pain Goal:   ? ?  ?  ?  ?  ?  ?  ?Epidural/Spinal Function Cutaneous sensation: Normal sensation (10/28/21 0748) ? ?Krrish Freund ? ? ? ? ?

## 2021-10-28 NOTE — Progress Notes (Signed)
Postpartum Progress Note ? ?Post Partum Day 1 s/p VBAC.  Patient reports well-controlled pain, ambulating without difficulty, voiding spontaneously, tolerating PO.  Vaginal bleeding is appropriate. ? ? ?Objective: ?Blood pressure 106/65, pulse 72, temperature 98.9 ?F (37.2 ?C), temperature source Oral, resp. rate 15, height 5\' 5"  (1.651 m), weight 102.4 kg, SpO2 97 %, unknown if currently breastfeeding. ? ?Physical Exam:  ?General: alert and no distress ?Lochia: appropriate ?Uterine Fundus: firm ?DVT Evaluation: No evidence of DVT seen on physical exam. ? ?Recent Labs  ?  10/26/21 ?2019 10/28/21 ?0454  ?HGB 11.6* 10.7*  ?HCT 34.4* 32.7*  ? ? ?Assessment/Plan: ?Postpartum Day 1, s/p vaginal delivery. ?Continue routine postpartum care ?A2GDM - well controlled during admission ?Lactation following ?Anticipate discharge home today ? ? LOS: 2 days  ? ?10/30/21 ?10/28/2021, 7:20 AM  ? ? ?

## 2021-10-28 NOTE — Discharge Summary (Signed)
Obstetric Discharge Summary ? ?[name] is a 31 y.o. female that presented on 10/26/2021 for SROM.  She was admitted to labor and delivery for TOLAC.  Her labor course was uncomplicated and she delivered a viable female infant on 10/27/2021.  Her postpartum course was uncomplicated and on PPD#1, she reported well controlled pain, spontaneous voiding, ambulating without difficulty, and tolerating PO.  She was stable for discharge home on  with plans for in-office follow up. ? ?Hemoglobin  ?Date Value Ref Range Status  ?10/28/2021 10.7 (L) 12.0 - 15.0 g/dL Final  ? ?HCT  ?Date Value Ref Range Status  ?10/28/2021 32.7 (L) 36.0 - 46.0 % Final  ? ? ?Physical Exam:  ?General: alert and no distress ?Lochia: appropriate ?Uterine Fundus: firm ?DVT Evaluation: No evidence of DVT seen on physical exam. ? ?Discharge Diagnoses: Term Pregnancy-delivered ? ?Discharge Information: ?Date: 10/28/2021 ?Activity: Pelvic rest, as tolerated ?Diet: routine ?Medications: Tylenol, motrin ?Condition: stable ?Instructions: Refer to practice specific booklet.  Discussed prior to discharge.  ?Discharge to: Home ? Follow-up Information   ? ? [place] Follow up.   ?Why: Please follow up for 6 week postpartum visit. ?Contact information: ?[address] ?[address] ?[place] [address] ?[phone] ? ? ?  ?  ? ?  ?  ? ?  ? ? ?Newborn Data: ?Live born female  ?Birth Weight: 6 lb 14.1 oz (3120 g) ?APGAR: 9, 9 ? ?Newborn Delivery   ?Birth date/time: 10/27/2021 08:46:00 ?Delivery type: VBAC, Spontaneous ?  ?  ? ?Home with mother. ? ?Lyn Henri ?10/28/2021, 10:00 PM ? ?

## 2021-10-28 NOTE — Lactation Note (Signed)
This note was copied from a baby's chart. ?Lactation Consultation Note ? ?Patient Name: [name] ?Today's Date: 10/28/2021 ?Reason for consult: Follow-up assessment;Early term 37-38.6wks;Maternal endocrine disorder ?Age:32 hours ? ?LC in to visit with P3 Mom of ET infant on day of discharge.  Baby at 3.2% weight loss with good output.  Mom denies difficulty with latching baby,  Reviewed basics of good breast support and pillow support under baby.   ?Baby sleeping STS on Mom after a recent feeding. ? ?Encouraged STS with baby as much as possible.  Mom to watch baby for feeding cues and offer the breast often. ? ?Engorgement prevention and treatment reviewed. ?Mom to F/U with IBCLC at [facility] office. ?Mom aware of OP lactation support available to her and encouraged her to call prn. ? ?Lactation Tools Discussed/Used ?Tools: Pump ?Flange Size: 24 ?Breast pump type: Manual;Double-Electric Breast Pump ?Pump Education: Setup, frequency, and cleaning ?Reason for Pumping: PRN to support milk supply ?Pumping frequency: Mom states she hasn't pumped, just latched baby to the breast ?Pumped volume: 0 mL ? ?Interventions ?Interventions: Breast feeding basics reviewed;Skin to skin;Breast massage;Hand express;Support pillows;Hand pump;DEBP;Education ? ?Discharge ?Discharge Education: Engorgement and breast care;Warning signs for feeding baby;Outpatient recommendation ?Pump: Hands Free Margarette [place]) ? ?Consult Status ?Consult Status: Complete ?Date: 10/28/21 ?Follow-up type: Call as needed ? ? ? ?Johny Blamer E ?10/28/2021, 10:49 AM ? ? ? ?

## 2021-10-28 NOTE — Progress Notes (Signed)
MOB was referred for history of depression/anxiety. ?* Referral screened out by Clinical Social Worker because none of the following criteria appear to apply: ?~ History of anxiety/depression during this pregnancy, or of post-partum depression following prior delivery. No concerns noted in prenatal records.  ?~ Diagnosis of anxiety and/or depression within last 3 years. Per chart review MOB's anxiety/depression dates back prior to 2014.  ?OR ?* MOB's symptoms currently being treated with medication and/or therapy. ?Please contact the Clinical Social Worker if needs arise, by [facility] request, or if MOB scores greater than 9/yes to question 10 on Edinburgh Postpartum Depression Screen. ? ?Celso Sickle, LCSW ?Clinical Social Worker ?[facility] ?Cell#: [phone] ?

## 2021-11-01 ENCOUNTER — Inpatient Hospital Stay (HOSPITAL_COMMUNITY): Payer: BC Managed Care – PPO

## 2021-11-01 ENCOUNTER — Encounter (HOSPITAL_COMMUNITY): Payer: Self-pay

## 2021-11-05 ENCOUNTER — Telehealth (HOSPITAL_COMMUNITY): Payer: Self-pay

## 2021-11-05 NOTE — Telephone Encounter (Signed)
No answer. Left message to return nurse call. ? Jerald Kief ?11/05/2021,1852 ?

## 2021-11-08 ENCOUNTER — Inpatient Hospital Stay (HOSPITAL_COMMUNITY): Admit: 2021-11-08 | Payer: Self-pay

## 2023-01-20 ENCOUNTER — Other Ambulatory Visit: Payer: Self-pay | Admitting: Gastroenterology

## 2023-01-20 DIAGNOSIS — R7989 Other specified abnormal findings of blood chemistry: Secondary | ICD-10-CM

## 2023-02-03 ENCOUNTER — Other Ambulatory Visit: Payer: BC Managed Care – PPO

## 2023-03-11 ENCOUNTER — Emergency Department (HOSPITAL_BASED_OUTPATIENT_CLINIC_OR_DEPARTMENT_OTHER)
Admission: EM | Admit: 2023-03-11 | Discharge: 2023-03-12 | Disposition: A | Discharge status: Home / Self Care | Payer: No Typology Code available for payment source | Attending: Emergency Medicine | Admitting: Emergency Medicine

## 2023-03-11 ENCOUNTER — Encounter (HOSPITAL_BASED_OUTPATIENT_CLINIC_OR_DEPARTMENT_OTHER): Payer: Self-pay

## 2023-03-11 ENCOUNTER — Other Ambulatory Visit: Payer: Self-pay

## 2023-03-11 DIAGNOSIS — K5900 Constipation, unspecified: Secondary | ICD-10-CM | POA: Insufficient documentation

## 2023-03-11 DIAGNOSIS — R1011 Right upper quadrant pain: Secondary | ICD-10-CM | POA: Insufficient documentation

## 2023-03-11 DIAGNOSIS — R110 Nausea: Secondary | ICD-10-CM | POA: Insufficient documentation

## 2023-03-11 LAB — URINALYSIS, ROUTINE W REFLEX MICROSCOPIC
Bilirubin Urine: NEGATIVE
Glucose, UA: NEGATIVE mg/dL
Hgb urine dipstick: NEGATIVE
Ketones, ur: NEGATIVE mg/dL
Leukocytes,Ua: NEGATIVE
Nitrite: NEGATIVE
Protein, ur: NEGATIVE mg/dL
Specific Gravity, Urine: 1.019 (ref 1.005–1.030)
pH: 6.5 (ref 5.0–8.0)

## 2023-03-11 LAB — COMPREHENSIVE METABOLIC PANEL
ALT: 77 U/L — ABNORMAL HIGH (ref 0–44)
AST: 37 U/L (ref 15–41)
Albumin: 4.2 g/dL (ref 3.5–5.0)
Alkaline Phosphatase: 57 U/L (ref 38–126)
Anion gap: 9 (ref 5–15)
BUN: 13 mg/dL (ref 6–20)
CO2: 25 mmol/L (ref 22–32)
Calcium: 8.9 mg/dL (ref 8.9–10.3)
Chloride: 104 mmol/L (ref 98–111)
Creatinine, Ser: 0.8 mg/dL (ref 0.44–1.00)
GFR, Estimated: >60 mL/min (ref >60)
Glucose, Bld: 107 mg/dL — ABNORMAL HIGH (ref 70–99)
Potassium: 3.6 mmol/L (ref 3.5–5.1)
Sodium: 138 mmol/L (ref 135–145)
Total Bilirubin: 0.5 mg/dL (ref 0.3–1.2)
Total Protein: 6.9 g/dL (ref 6.5–8.1)

## 2023-03-11 LAB — CBC
HCT: 38 % (ref 36.0–46.0)
Hemoglobin: 13.3 g/dL (ref 12.0–15.0)
MCH: 30 pg (ref 26.0–34.0)
MCHC: 35 g/dL (ref 30.0–36.0)
MCV: 85.8 fL (ref 80.0–100.0)
Platelets: 218 10*3/uL (ref 150–400)
RBC: 4.43 MIL/uL (ref 3.87–5.11)
RDW: 13.4 % (ref 11.5–15.5)
WBC: 9.8 10*3/uL (ref 4.0–10.5)
nRBC: 0 % (ref 0.0–0.2)

## 2023-03-11 LAB — PREGNANCY, URINE: Preg Test, Ur: NEGATIVE

## 2023-03-11 LAB — LIPASE, BLOOD: Lipase: 31 U/L (ref 11–51)

## 2023-03-11 NOTE — ED Triage Notes (Signed)
Pt c/o right sided abdominal pain x 6-7 hours, denies nausea, vomiting, diarrhea, BM today but states she was constipated, c/o feeling bloated. Dull lower back pain yesterday.

## 2023-03-12 ENCOUNTER — Emergency Department (HOSPITAL_BASED_OUTPATIENT_CLINIC_OR_DEPARTMENT_OTHER): Payer: No Typology Code available for payment source

## 2023-03-12 NOTE — ED Provider Notes (Signed)
[place] EMERGENCY DEPARTMENT AT [facility]  Provider Note  CSN: [id number] Arrival date & time: 03/11/23 2109  History Chief Complaint  Patient presents with   Abdominal Pain    [name] is a 32 y.o. female with history of fatty liver reports sudden onset of RUQ abdominal pain around 1400hrs on day of arrival, associated with some nausea but no vomiting. Was mildly constipated 2 days ago but had a normal BM today. No urinary symptoms. No prior gall bladder issues. Since arriving in the ED, her pain has improved considerably.    Home Medications Prior to Admission medications   Medication Sig Start Date End Date Taking? Authorizing Provider  acetaminophen (TYLENOL) 325 MG tablet Take 2 tablets (650 mg total) by mouth every 4 (four) hours as needed (for pain scale < 4). 10/28/21   Lyn Henri, MD  ibuprofen (ADVIL) 600 MG tablet Take 1 tablet (600 mg total) by mouth every 6 (six) hours. 10/28/21   Lyn Henri, MD  ondansetron (ZOFRAN-ODT) 4 MG disintegrating tablet Take 1 tablet (4 mg total) by mouth every 8 (eight) hours as needed for nausea or vomiting. 10/26/21   Leftwich-Kirby, Wilmer Floor, CNM  Prenatal Vit-Fe Fumarate-FA (PRENATAL MULTIVITAMIN) TABS Take 2 tablets by mouth daily. gummy    [provider]     Allergies    Azithromycin   Review of Systems   Review of Systems Please see HPI for pertinent positives and negatives  Physical Exam BP 116/67   Pulse 80   Temp 98.2 F (36.8 C) (Oral)   Resp 16   Ht 5\' 5"  (1.651 m)   Wt 104.3 kg   LMP 02/24/2023   SpO2 98%   BMI 38.27 kg/m   Physical Exam Vitals and nursing note reviewed.  Constitutional:      Appearance: Normal appearance.  HENT:     Head: Normocephalic and atraumatic.     Nose: Nose normal.     Mouth/Throat:     Mouth: Mucous membranes are moist.  Eyes:     Extraocular Movements: Extraocular movements intact.     Conjunctiva/sclera: Conjunctivae normal.   Cardiovascular:     Rate and Rhythm: Normal rate.  Pulmonary:     Effort: Pulmonary effort is normal.     Breath sounds: Normal breath sounds.  Abdominal:     General: Abdomen is flat.     Palpations: Abdomen is soft.     Tenderness: There is no abdominal tenderness. There is no guarding. Negative signs include Murphy's sign and McBurney's sign.  Musculoskeletal:        General: No swelling. Normal range of motion.     Cervical back: Neck supple.  Skin:    General: Skin is warm and dry.  Neurological:     General: No focal deficit present.     Mental Status: She is alert.  Psychiatric:        Mood and Affect: Mood normal.     ED Results / Procedures / Treatments   EKG None  Procedures Procedures  Medications Ordered in the ED Medications - No data to display  Initial Impression and Plan  Patient here with an episode of RUQ abdominal pain earlier today that has since resolved. Exam is benign. Vitals reassuring. Labs done in triage show normal CBC, CMP with mildly elevated AST, otherwise normal. Lipase is normal. UA is neg. Suspect either gas pains from constipation or biliary colic. Low suspicion for acute infectious process. Will  check [place] to eval gall stones.   ED Course   Clinical Course as of 03/12/23 0116  Thu Mar 12, 2023  0115 I personally viewed the images from radiology studies and agree with radiologist interpretation:  [place] is neg for gall stones. There is fatty liver as previously known. She remains pain free. No indication for admission or further ED workup. Recommend PCP follow up, RTED for any other concerns.   [CS]    Clinical Course User Index [CS] Pollyann Savoy, MD     MDM Rules/Calculators/A&P Medical Decision Making Problems Addressed: RUQ abdominal pain: acute illness or injury  Amount and/or Complexity of Data Reviewed Labs: ordered. Decision-making details documented in ED Course. Radiology: ordered and independent interpretation  performed. Decision-making details documented in ED Course.     Final Clinical Impression(s) / ED Diagnoses Final diagnoses:  RUQ abdominal pain    Rx / DC Orders ED Discharge Orders     None        Pollyann Savoy, MD 03/12/23 [phone]

## 2023-03-12 NOTE — ED Notes (Signed)
Reviewed AVS with patient, patient expressed understanding of directions, denies further questions at this time.
